# Patient Record
Sex: Female | Born: 1975 | Hispanic: No | Marital: Married | Smoking: Never smoker
Health system: Southern US, Community
[De-identification: ages and names within clinical notes are randomized; demographics above are authoritative.]

## PROBLEM LIST (undated history)

## (undated) DIAGNOSIS — R011 Cardiac murmur, unspecified: Secondary | ICD-10-CM

## (undated) HISTORY — PX: NO PAST SURGERIES: SHX2092

---

## 2012-07-25 ENCOUNTER — Other Ambulatory Visit (HOSPITAL_COMMUNITY): Payer: Self-pay | Admitting: Internal Medicine

## 2012-07-25 DIAGNOSIS — R011 Cardiac murmur, unspecified: Secondary | ICD-10-CM

## 2012-07-30 ENCOUNTER — Ambulatory Visit (HOSPITAL_COMMUNITY)
Admission: RE | Admit: 2012-07-30 | Discharge: 2012-07-30 | Disposition: A | Discharge status: Home / Self Care | Payer: Medicaid Other | Source: Ambulatory Visit | Attending: Internal Medicine | Admitting: Internal Medicine

## 2012-07-30 DIAGNOSIS — R011 Cardiac murmur, unspecified: Secondary | ICD-10-CM | POA: Insufficient documentation

## 2012-07-30 DIAGNOSIS — I059 Rheumatic mitral valve disease, unspecified: Secondary | ICD-10-CM | POA: Insufficient documentation

## 2012-07-30 NOTE — Progress Notes (Signed)
[facility]  2D Echocardiogram completed.  Branton Einstein L Nafeesah Lapaglia, RDCS 07/30/2012 1:45 PM

## 2013-01-15 ENCOUNTER — Encounter (HOSPITAL_COMMUNITY): Payer: Self-pay | Admitting: Emergency Medicine

## 2013-01-15 ENCOUNTER — Emergency Department (HOSPITAL_COMMUNITY)
Admission: EM | Admit: 2013-01-15 | Discharge: 2013-01-15 | Disposition: A | Discharge status: Home / Self Care | Payer: Medicaid Other | Attending: Emergency Medicine | Admitting: Emergency Medicine

## 2013-01-15 DIAGNOSIS — K047 Periapical abscess without sinus: Secondary | ICD-10-CM

## 2013-01-15 DIAGNOSIS — R599 Enlarged lymph nodes, unspecified: Secondary | ICD-10-CM | POA: Insufficient documentation

## 2013-01-15 MED ORDER — HYDROCODONE-ACETAMINOPHEN 5-325 MG PO TABS
1.0000 | ORAL_TABLET | Freq: Once | ORAL | Status: AC
  Administered 2013-01-15: 1 via ORAL
  Filled 2013-01-15: qty 1

## 2013-01-15 MED ORDER — PENICILLIN V POTASSIUM 500 MG PO TABS: 500.0000 mg | ORAL_TABLET | Freq: Four times a day (QID) | ORAL | Status: DC

## 2013-01-15 MED ORDER — IBUPROFEN 200 MG PO TABS
600.0000 mg | ORAL_TABLET | Freq: Once | ORAL | Status: AC
  Administered 2013-01-15: 600 mg via ORAL
  Filled 2013-01-15: qty 3

## 2013-01-15 MED ORDER — HYDROCODONE-ACETAMINOPHEN 5-325 MG PO TABS: ORAL_TABLET | ORAL | Status: DC

## 2013-01-15 NOTE — ED Provider Notes (Signed)
CSN: [id number]     Arrival date & time 01/15/13  8657 History   First MD Initiated Contact with Patient 01/15/13 0804     Chief Complaint  Patient presents with  . Dental Pain   (Consider location/radiation/quality/duration/timing/severity/associated sxs/prior Treatment) HPI  Patient reports she has had a toothache in the right upper maxilla for the past 2 weeks, off and on. She has seen a dentist and has an appointment in 3 days to have a molar removed on the right upper and left upper maxilla.She reports this morning she woke up with some swelling in her face with some increased pain. She denies any fever or chills.  PCP  [facility]  History reviewed. No pertinent past medical history. History reviewed. No pertinent past surgical history. History reviewed. No pertinent family history. History  Substance Use Topics  . Smoking status: Never Smoker   . Smokeless tobacco: Not on file  . Alcohol Use: No   employed   OB History   Grav Para Term Preterm Abortions TAB SAB Ect Mult Living   5              Review of Systems  All other systems reviewed and are negative.    Allergies  Review of patient's allergies indicates no known allergies.  Home Medications   Current Outpatient Rx  Name  Route  Sig  Dispense  Refill  . acetaminophen (TYLENOL) 500 MG tablet   Oral   Take 500 mg by mouth daily as needed for moderate pain.         . benzocaine (ORAJEL) 10 % mucosal gel   Mouth/Throat   Use as directed 1 application in the mouth or throat as needed for mouth pain.          BP 115/73  Pulse 61  Temp(Src) 98.7 F (37.1 C) (Oral)  Ht 5\' 4"  (1.626 m)  Wt 110 lb (49.896 kg)  BMI 18.87 kg/m2  SpO2 100%  LMP 01/15/2013  Vital signs normal   Physical Exam  Nursing note and vitals reviewed. Constitutional: She is oriented to person, place, and time. She appears well-developed and well-nourished.  Non-toxic appearance. She does not appear ill. No  distress.  HENT:  Head: Normocephalic and atraumatic.    Right Ear: External ear normal.  Left Ear: External ear normal.  Nose: Nose normal. No mucosal edema or rhinorrhea.  Mouth/Throat: Oropharynx is clear and moist and mucous membranes are normal. No dental abscesses or uvula swelling.    Small area of swelling noted in the right face as shown.   Pt has several dental caries, also in the left upper, right lower, right upper  Eyes: Conjunctivae and EOM are normal. Pupils are equal, round, and reactive to light.  Neck: Normal range of motion and full passive range of motion without pain. Neck supple.  Pulmonary/Chest: Effort normal. No respiratory distress. She has no rhonchi. She exhibits no crepitus.  Abdominal: Normal appearance.  Musculoskeletal: Normal range of motion.  Moves all extremities well.   Lymphadenopathy:    She has cervical adenopathy.       Right cervical: Posterior cervical adenopathy present.       Left cervical: Posterior cervical adenopathy present.  Neurological: She is alert and oriented to person, place, and time. She has normal strength. No cranial nerve deficit.  Skin: Skin is warm, dry and intact. No rash noted. No erythema. No pallor.  Psychiatric: She has a normal mood and affect. Her  speech is normal and behavior is normal. Her mood appears not anxious.    ED Course  Procedures (including critical care time)  Medications  HYDROcodone-acetaminophen (NORCO/VICODIN) 5-325 MG per tablet 1 tablet (not administered)  ibuprofen (ADVIL,MOTRIN) tablet 600 mg (not administered)      Labs Review Labs Reviewed - No data to display Imaging Review No results found.  EKG Interpretation   None       MDM   1. Abscessed tooth     New Prescriptions   HYDROCODONE-ACETAMINOPHEN (NORCO/VICODIN) 5-325 MG PER TABLET    Take 1 or 2 po Q 6hrs for pain   PENICILLIN V POTASSIUM (VEETID) 500 MG TABLET    Take 1 tablet (500 mg total) by mouth 4 (four) times  daily.    Plan discharge   Devoria Albe, MD, Franz Dell, MD 01/16/13 [phone]

## 2013-01-15 NOTE — ED Notes (Signed)
Pt reports right upper side dental pain x 2 weeks; appt on Friday to have tooth removed. Pt awoke this AM to swelling of right side of face.

## 2013-09-27 ENCOUNTER — Telehealth: Payer: Self-pay | Admitting: Hematology and Oncology

## 2013-09-27 NOTE — Telephone Encounter (Signed)
PER PATIENT WILL CALL BACK TO SCHEDULE APPT HAS ANOTHER APPT ON TODAY W/Gina Copeland TO DISCUSS LABS, CONTACT NUMBER WAS GIVEN.

## 2013-12-02 ENCOUNTER — Encounter (HOSPITAL_COMMUNITY): Payer: Self-pay | Admitting: Emergency Medicine

## 2015-06-10 ENCOUNTER — Emergency Department (HOSPITAL_COMMUNITY)
Admission: EM | Admit: 2015-06-10 | Discharge: 2015-06-10 | Disposition: A | Discharge status: Home / Self Care | Payer: No Typology Code available for payment source | Attending: Emergency Medicine | Admitting: Emergency Medicine

## 2015-06-10 ENCOUNTER — Encounter (HOSPITAL_COMMUNITY): Payer: Self-pay | Admitting: Emergency Medicine

## 2015-06-10 ENCOUNTER — Emergency Department (HOSPITAL_COMMUNITY): Payer: No Typology Code available for payment source

## 2015-06-10 DIAGNOSIS — Y998 Other external cause status: Secondary | ICD-10-CM | POA: Insufficient documentation

## 2015-06-10 DIAGNOSIS — Y9389 Activity, other specified: Secondary | ICD-10-CM | POA: Insufficient documentation

## 2015-06-10 DIAGNOSIS — Z792 Long term (current) use of antibiotics: Secondary | ICD-10-CM | POA: Insufficient documentation

## 2015-06-10 DIAGNOSIS — S8991XA Unspecified injury of right lower leg, initial encounter: Secondary | ICD-10-CM | POA: Diagnosis not present

## 2015-06-10 DIAGNOSIS — Y92410 Unspecified street and highway as the place of occurrence of the external cause: Secondary | ICD-10-CM | POA: Diagnosis not present

## 2015-06-10 DIAGNOSIS — M25561 Pain in right knee: Secondary | ICD-10-CM

## 2015-06-10 MED ORDER — NAPROXEN 500 MG PO TABS: 500.0000 mg | ORAL_TABLET | Freq: Two times a day (BID) | ORAL | Status: DC
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
## 2015-06-10 NOTE — Discharge Instructions (Signed)
Take the prescribed medication as directed. °Follow-up with your primary care doctor. °Return to the ED for new or worsening symptoms. °

## 2015-08-15 ENCOUNTER — Encounter (HOSPITAL_COMMUNITY): Payer: Self-pay | Admitting: *Deleted

## 2015-08-15 ENCOUNTER — Ambulatory Visit (HOSPITAL_COMMUNITY)
Admission: EM | Admit: 2015-08-15 | Discharge: 2015-08-15 | Disposition: A | Discharge status: Home / Self Care | Payer: Self-pay | Attending: Emergency Medicine | Admitting: Emergency Medicine

## 2015-08-15 DIAGNOSIS — M791 Myalgia, unspecified site: Secondary | ICD-10-CM

## 2015-08-15 DIAGNOSIS — M545 Low back pain, unspecified: Secondary | ICD-10-CM

## 2015-08-15 DIAGNOSIS — T148 Other injury of unspecified body region: Secondary | ICD-10-CM

## 2015-08-15 DIAGNOSIS — T148XXA Other injury of unspecified body region, initial encounter: Secondary | ICD-10-CM

## 2015-08-15 MED ORDER — NAPROXEN 375 MG PO TABS: 375.0000 mg | ORAL_TABLET | Freq: Two times a day (BID) | ORAL | Status: DC

## 2015-08-15 NOTE — Discharge Instructions (Signed)
Back Pain, Adult °Back pain is very common in adults. The cause of back pain is rarely dangerous and the pain often gets better over time. The cause of your back pain may not be known. Some common causes of back pain include: °· Strain of the muscles or ligaments supporting the spine. °· Wear and tear (degeneration) of the spinal disks. °· Arthritis. °· Direct injury to the back. °For many people, back pain may return. Since back pain is rarely dangerous, most people can learn to manage this condition on their own. °HOME CARE INSTRUCTIONS °Watch your back pain for any changes. The following actions may help to lessen any discomfort you are feeling: °· Remain active. It is stressful on your back to sit or stand in one place for long periods of time. Do not sit, drive, or stand in one place for more than 30 minutes at a time. Take short walks on even surfaces as soon as you are able. Try to increase the length of time you walk each day. °· Exercise regularly as directed by your health care provider. Exercise helps your back heal faster. It also helps avoid future injury by keeping your muscles strong and flexible. °· Do not stay in bed. Resting more than 1-2 days can delay your recovery. °· Pay attention to your body when you bend and lift. The most comfortable positions are those that put less stress on your recovering back. Always use proper lifting techniques, including: °¨ Bending your knees. °¨ Keeping the load close to your body. °¨ Avoiding twisting. °· Find a comfortable position to sleep. Use a firm mattress and lie on your side with your knees slightly bent. If you lie on your back, put a pillow under your knees. °· Avoid feeling anxious or stressed. Stress increases muscle tension and can worsen back pain. It is important to recognize when you are anxious or stressed and learn ways to manage it, such as with exercise. °· Take medicines only as directed by your health care provider. Over-the-counter  medicines to reduce pain and inflammation are often the most helpful. Your health care provider may prescribe muscle relaxant drugs. These medicines help dull your pain so you can more quickly return to your normal activities and healthy exercise. °· Apply ice to the injured area: °¨ Put ice in a plastic bag. °¨ Place a towel between your skin and the bag. °¨ Leave the ice on for 20 minutes, 2-3 times a day for the first 2-3 days. After that, ice and heat may be alternated to reduce pain and spasms. °· Maintain a healthy weight. Excess weight puts extra stress on your back and makes it difficult to maintain good posture. °SEEK MEDICAL CARE IF: °· You have pain that is not relieved with rest or medicine. °· You have increasing pain going down into the legs or buttocks. °· You have pain that does not improve in one week. °· You have night pain. °· You lose weight. °· You have a fever or chills. °SEEK IMMEDIATE MEDICAL CARE IF:  °· You develop new bowel or bladder control problems. °· You have unusual weakness or numbness in your arms or legs. °· You develop nausea or vomiting. °· You develop abdominal pain. °· You feel faint. °  °This information is not intended to replace advice given to you by your health care provider. Make sure you discuss any questions you have with your health care provider. °  °Document Released: 01/17/2005 Document Revised: 02/07/2014 Document Reviewed: 05/21/2013 °Elsevier Interactive Patient Education ©2016 Elsevier   Inc.  Muscle Pain, Adult Muscle pain (myalgia) may be caused by many things, including:  Overuse or muscle strain, especially if you are not in shape. This is the most common cause of muscle pain.  Injury.  Bruises.  Viruses, such as the flu.  Infectious diseases.  Fibromyalgia, which is a chronic condition that causes muscle tenderness, fatigue, and headache.  Autoimmune diseases, including lupus.  Certain drugs, including ACE inhibitors and statins. Muscle  pain may be mild or severe. In most cases, the pain lasts only a short time and goes away without treatment. To diagnose the cause of your muscle pain, your health care provider will take your medical history. This means he or she will ask you when your muscle pain began and what has been happening. If you have not had muscle pain for very long, your health care provider may want to wait before doing much testing. If your muscle pain has lasted a long time, your health care provider may want to run tests right away. If your health care provider thinks your muscle pain may be caused by illness, you may need to have additional tests to rule out certain conditions.  Treatment for muscle pain depends on the cause. Home care is often enough to relieve muscle pain. Your health care provider may also prescribe anti-inflammatory medicine. HOME CARE INSTRUCTIONS Watch your condition for any changes. The following actions may help to lessen any discomfort you are feeling:  Only take over-the-counter or prescription medicines as directed by your health care provider.  Apply ice to the sore muscle:  Put ice in a plastic bag.  Place a towel between your skin and the bag.  Leave the ice on for 15-20 minutes, 3-4 times a day.  You may alternate applying hot and cold packs to the muscle as directed by your health care provider.  If overuse is causing your muscle pain, slow down your activities until the pain goes away.  Remember that it is normal to feel some muscle pain after starting a workout program. Muscles that have not been used often will be sore at first.  Do regular, gentle exercises if you are not usually active.  Warm up before exercising to lower your risk of muscle pain.  Do not continue working out if the pain is very bad. Bad pain could mean you have injured a muscle. SEEK MEDICAL CARE IF:  Your muscle pain gets worse, and medicines do not help.  You have muscle pain that lasts longer  than 3 days.  You have a rash or fever along with muscle pain.  You have muscle pain after a tick bite.  You have muscle pain while working out, even though you are in good physical condition.  You have redness, soreness, or swelling along with muscle pain.  You have muscle pain after starting a new medicine or changing the dose of a medicine. SEEK IMMEDIATE MEDICAL CARE IF:  You have trouble breathing.  You have trouble swallowing.  You have muscle pain along with a stiff neck, fever, and vomiting.  You have severe muscle weakness or cannot move part of your body. MAKE SURE YOU:   Understand these instructions.  Will watch your condition.  Will get help right away if you are not doing well or get worse.   This information is not intended to replace advice given to you by your health care provider. Make sure you discuss any questions you have with your health care provider.  Document Released: 12/09/2005 Document Revised: 02/07/2014 Document Reviewed: 11/13/2012 Elsevier Interactive Patient Education [employer].
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
## 2016-05-25 ENCOUNTER — Other Ambulatory Visit: Payer: Self-pay | Admitting: Internal Medicine

## 2016-05-25 ENCOUNTER — Ambulatory Visit
Admission: RE | Admit: 2016-05-25 | Discharge: 2016-05-25 | Disposition: A | Discharge status: Home / Self Care | Payer: Medicaid Other | Source: Ambulatory Visit | Attending: Internal Medicine | Admitting: Internal Medicine

## 2016-05-25 DIAGNOSIS — R7611 Nonspecific reaction to tuberculin skin test without active tuberculosis: Secondary | ICD-10-CM

## 2016-12-06 ENCOUNTER — Other Ambulatory Visit: Payer: Self-pay

## 2016-12-06 ENCOUNTER — Ambulatory Visit (INDEPENDENT_AMBULATORY_CARE_PROVIDER_SITE_OTHER): Payer: Worker's Compensation | Admitting: Emergency Medicine

## 2016-12-06 ENCOUNTER — Encounter: Payer: Self-pay | Admitting: Emergency Medicine

## 2016-12-06 VITALS — BP 120/72 | HR 70 | Temp 98.3°F | Resp 16 | Ht 64.5 in | Wt 115.8 lb

## 2016-12-06 DIAGNOSIS — S0993XA Unspecified injury of face, initial encounter: Secondary | ICD-10-CM | POA: Diagnosis not present

## 2016-12-06 DIAGNOSIS — S0592XA Unspecified injury of left eye and orbit, initial encounter: Secondary | ICD-10-CM | POA: Insufficient documentation

## 2016-12-06 NOTE — Patient Instructions (Addendum)
     IF you received an x-ray today, you will receive an invoice from [facility]. Please contact [facility] at [phone] with questions or concerns regarding your invoice.   IF you received labwork today, you will receive an invoice from [facility]. Please contact [facility] at [phone] with questions or concerns regarding your invoice.   Our billing staff will not be able to assist you with questions regarding bills from these companies.  You will be contacted with the lab results as soon as they are available. The fastest way to get your results is to activate your My Chart account. Instructions are located on the last page of this paperwork. If you have not heard from [place] regarding the results in 2 weeks, please contact this office.     Eye Contusion An eye contusion is a deep bruise of the eye or the area around the eye. This is often called a "black eye." A black eye can happen when an injury causes bleeding under the skin. The skin over the bruise may turn blue, purple, or yellow. Minor injuries may be painless. Worse bruises may be painful and swollen for a few weeks. A black eye can affect your eyeball and your sight. Follow these instructions at home:  If directed, put ice on the injured area: ? Put ice in a plastic bag. ? Place a towel between your skin and the bag. ? Leave the ice on for 20 minutes, 2-3 times per day.  Take over-the-counter and prescription medicines only as told by your doctor.  Sleep with your head raised (elevated). You may do this by putting an extra pillow under your head.  Return to your normal activities as told by your doctor. Ask your doctor what activities are safe for you.  Keep all follow-up visits as told by your doctor. This is important. Contact a doctor if:  Your symptoms do not get better after several days.  Medicine does not help your swelling or pain. Get help right away if:  You lose your sight.  You are  seeing double.  Your eye suddenly turns red.  The black part of your eye (pupil) is an odd shape or size.  You have very bad pain.  You feel sick to your stomach (nauseous).  You throw up (vomit).  You feel dizzy or sleepy, or you feel like you will pass out (faint).  You pass out.  You have a lot of clear fluid coming from your eye or nose. This information is not intended to replace advice given to you by your health care provider. Make sure you discuss any questions you have with your health care provider. Document Released: 01/06/2011 Document Revised: 06/25/2015 Document Reviewed: 09/23/2014 Elsevier Interactive Patient Education  [employer].

## 2016-12-06 NOTE — Progress Notes (Signed)
Gina Copeland 41 y.o.   Chief Complaint  Patient presents with  . worker's comp    injury to LEFT eye on 12/06/2016    HISTORY OF PRESENT ILLNESS: This is a 41 y.o. female complaining of left eye injury sustained today at work; no visual problems.  HPI   Prior to Admission medications   Medication Sig Start Date End Date Taking? Authorizing Provider  acetaminophen (TYLENOL) 500 MG tablet Take 500 mg by mouth daily as needed for moderate pain.    [provider]  benzocaine (ORAJEL) 10 % mucosal gel Use as directed 1 application in the mouth or throat as needed for mouth pain.    [provider]  HYDROcodone-acetaminophen (NORCO/VICODIN) 5-325 MG per tablet Take 1 or 2 po Q 6hrs for pain Patient not taking: Reported on 12/06/2016 01/15/13   Rolland Porter, MD  naproxen (NAPROSYN) 375 MG tablet Take 1 tablet (375 mg total) by mouth 2 (two) times daily. Patient not taking: Reported on 12/06/2016 08/15/15   Janne Napoleon, NP  penicillin v potassium (VEETID) 500 MG tablet Take 1 tablet (500 mg total) by mouth 4 (four) times daily. Patient not taking: Reported on 12/06/2016 01/15/13   Rolland Porter, MD    No Known Allergies  There are no active problems to display for this patient.   No past medical history on file.  No past surgical history on file.  Social History   Socioeconomic History  . Marital status: Married    Spouse name: Not on file  . Number of children: Not on file  . Years of education: Not on file  . Highest education level: Not on file  Social Needs  . Financial resource strain: Not on file  . Food insecurity - worry: Not on file  . Food insecurity - inability: Not on file  . Transportation needs - medical: Not on file  . Transportation needs - non-medical: Not on file  Occupational History  . Not on file  Tobacco Use  . Smoking status: Never Smoker  . Smokeless tobacco: Never Used  Substance and Sexual Activity  . Alcohol use: No  . Drug use: No    . Sexual activity: Not on file  Other Topics Concern  . Not on file  Social History Narrative  . Not on file    No family history on file.   Review of Systems  Constitutional: Negative for chills and fever.  HENT: Negative for ear discharge.   Eyes: Negative for blurred vision, double vision, photophobia, pain, discharge and redness.  Respiratory: Negative for shortness of breath.   Cardiovascular: Negative for chest pain.  Gastrointestinal: Negative for nausea and vomiting.  Skin: Negative for rash.  Neurological: Negative for dizziness, loss of consciousness and headaches.  All other systems reviewed and are negative.  Vitals:   12/06/16 1457  BP: 120/72  Pulse: 70  Resp: 16  Temp: 98.3 F (36.8 C)  SpO2: 100%     Physical Exam  Constitutional: She is oriented to person, place, and time. She appears well-developed and well-nourished.  HENT:  Head: Normocephalic and atraumatic.  Right Ear: External ear normal.  Left Ear: External ear normal.  Mouth/Throat: Oropharynx is clear and moist.  Eyes: Conjunctivae, EOM and lids are normal. Pupils are equal, round, and reactive to light. Right conjunctiva is not injected. Right conjunctiva has no hemorrhage. Left conjunctiva is not injected. Left conjunctiva has no hemorrhage.  Neck: Normal range of motion. Neck supple.  Cardiovascular: Normal  rate.  Pulmonary/Chest: Effort normal.  Musculoskeletal: Normal range of motion.  Neurological: She is alert and oriented to person, place, and time. No sensory deficit. She exhibits normal muscle tone.  Skin: Skin is warm and dry.  Psychiatric: She has a normal mood and affect. Her behavior is normal.  Vitals reviewed.    ASSESSMENT & PLAN: Gina Copeland was seen today for worker's comp.  Diagnoses and all orders for this visit:  Left eye injury, initial encounter  Facial injury, initial encounter    Patient Instructions       IF you received an x-ray today, you will  receive an invoice from [facility]. Please contact [facility] at [phone] with questions or concerns regarding your invoice.   IF you received labwork today, you will receive an invoice from [facility]. Please contact [facility] at [phone] with questions or concerns regarding your invoice.   Our billing staff will not be able to assist you with questions regarding bills from these companies.  You will be contacted with the lab results as soon as they are available. The fastest way to get your results is to activate your My Chart account. Instructions are located on the last page of this paperwork. If you have not heard from [place] regarding the results in 2 weeks, please contact this office.     Eye Contusion An eye contusion is a deep bruise of the eye or the area around the eye. This is often called a "black eye." A black eye can happen when an injury causes bleeding under the skin. The skin over the bruise may turn blue, purple, or yellow. Minor injuries may be painless. Worse bruises may be painful and swollen for a few weeks. A black eye can affect your eyeball and your sight. Follow these instructions at home:  If directed, put ice on the injured area: ? Put ice in a plastic bag. ? Place a towel between your skin and the bag. ? Leave the ice on for 20 minutes, 2-3 times per day.  Take over-the-counter and prescription medicines only as told by your doctor.  Sleep with your head raised (elevated). You may do this by putting an extra pillow under your head.  Return to your normal activities as told by your doctor. Ask your doctor what activities are safe for you.  Keep all follow-up visits as told by your doctor. This is important. Contact a doctor if:  Your symptoms do not get better after several days.  Medicine does not help your swelling or pain. Get help right away if:  You lose your sight.  You are seeing double.  Your eye suddenly turns red.  The  black part of your eye (pupil) is an odd shape or size.  You have very bad pain.  You feel sick to your stomach (nauseous).  You throw up (vomit).  You feel dizzy or sleepy, or you feel like you will pass out (faint).  You pass out.  You have a lot of clear fluid coming from your eye or nose. This information is not intended to replace advice given to you by your health care provider. Make sure you discuss any questions you have with your health care provider. Document Released: 01/06/2011 Document Revised: 06/25/2015 Document Reviewed: 09/23/2014 Elsevier Interactive Patient Education  2018 Elsevier Inc.      Agustina Caroli, MD [facility]

## 2018-02-15 ENCOUNTER — Other Ambulatory Visit: Payer: Self-pay | Admitting: Family Medicine

## 2018-02-15 DIAGNOSIS — Z1231 Encounter for screening mammogram for malignant neoplasm of breast: Secondary | ICD-10-CM

## 2019-06-17 ENCOUNTER — Other Ambulatory Visit: Payer: Self-pay

## 2019-06-17 ENCOUNTER — Inpatient Hospital Stay (HOSPITAL_COMMUNITY): Payer: Medicaid Other

## 2019-06-17 ENCOUNTER — Encounter (HOSPITAL_COMMUNITY): Payer: Self-pay | Admitting: Obstetrics and Gynecology

## 2019-06-17 ENCOUNTER — Inpatient Hospital Stay (HOSPITAL_COMMUNITY)
Admission: AD | Admit: 2019-06-17 | Discharge: 2019-06-17 | Disposition: A | Discharge status: Home / Self Care | Payer: Medicaid Other | Attending: Obstetrics and Gynecology | Admitting: Obstetrics and Gynecology

## 2019-06-17 DIAGNOSIS — O209 Hemorrhage in early pregnancy, unspecified: Secondary | ICD-10-CM | POA: Insufficient documentation

## 2019-06-17 DIAGNOSIS — Z3A00 Weeks of gestation of pregnancy not specified: Secondary | ICD-10-CM

## 2019-06-17 DIAGNOSIS — Z3A09 9 weeks gestation of pregnancy: Secondary | ICD-10-CM | POA: Insufficient documentation

## 2019-06-17 DIAGNOSIS — O09521 Supervision of elderly multigravida, first trimester: Secondary | ICD-10-CM | POA: Diagnosis not present

## 2019-06-17 DIAGNOSIS — D259 Leiomyoma of uterus, unspecified: Secondary | ICD-10-CM | POA: Diagnosis not present

## 2019-06-17 DIAGNOSIS — O3411 Maternal care for benign tumor of corpus uteri, first trimester: Secondary | ICD-10-CM | POA: Diagnosis not present

## 2019-06-17 DIAGNOSIS — N939 Abnormal uterine and vaginal bleeding, unspecified: Secondary | ICD-10-CM | POA: Diagnosis present

## 2019-06-17 DIAGNOSIS — R109 Unspecified abdominal pain: Secondary | ICD-10-CM | POA: Insufficient documentation

## 2019-06-17 DIAGNOSIS — O26891 Other specified pregnancy related conditions, first trimester: Secondary | ICD-10-CM | POA: Diagnosis not present

## 2019-06-17 DIAGNOSIS — O3410 Maternal care for benign tumor of corpus uteri, unspecified trimester: Secondary | ICD-10-CM

## 2019-06-17 DIAGNOSIS — O3680X0 Pregnancy with inconclusive fetal viability, not applicable or unspecified: Secondary | ICD-10-CM | POA: Diagnosis not present

## 2019-06-17 HISTORY — DX: Cardiac murmur, unspecified: R01.1

## 2019-06-17 LAB — CBC
HCT: 24.9 % — ABNORMAL LOW (ref 36.0–46.0)
Hemoglobin: 7 g/dL — ABNORMAL LOW (ref 12.0–15.0)
MCH: 20.6 pg — ABNORMAL LOW (ref 26.0–34.0)
MCHC: 28.1 g/dL — ABNORMAL LOW (ref 30.0–36.0)
MCV: 73.5 fL — ABNORMAL LOW (ref 80.0–100.0)
Platelets: 196 10*3/uL (ref 150–400)
RBC: 3.39 MIL/uL — ABNORMAL LOW (ref 3.87–5.11)
RDW: 19.8 % — ABNORMAL HIGH (ref 11.5–15.5)
WBC: 6.8 10*3/uL (ref 4.0–10.5)
nRBC: 0.3 % — ABNORMAL HIGH (ref 0.0–0.2)

## 2019-06-17 LAB — I-STAT BETA HCG BLOOD, ED (MC, WL, AP ONLY): I-stat hCG, quantitative: >2000 m[IU]/mL — ABNORMAL HIGH (ref <5)

## 2019-06-17 LAB — HCG, QUANTITATIVE, PREGNANCY: hCG, Beta Chain, Quant, S: 6524 m[IU]/mL — ABNORMAL HIGH (ref <5)

## 2019-06-17 LAB — ABO/RH: ABO/RH(D): O POS

## 2019-06-17 NOTE — Discharge Instructions (Signed)
Return to care   If you have heavier bleeding that soaks through more that 2 pads per hour for an hour or more  If you bleed so much that you feel like you might pass out or you do pass out  If you have significant abdominal pain that is not improved with Tylenol   If you develop a fever > 100.5    Uterine Fibroids  Uterine fibroids (leiomyomas) are noncancerous (benign) tumors that can develop in the uterus. Fibroids may also develop in the fallopian tubes, cervix, or tissues (ligaments) near the uterus. You may have one or many fibroids. Fibroids vary in size, weight, and where they grow in the uterus. Some can become quite large. Most fibroids do not require medical treatment. What are the causes? The cause of this condition is not known. What increases the risk? You are more likely to develop this condition if you:  Are in your 30s or 40s and have not gone through menopause.  Have a family history of this condition.  Are of African-American descent.  Had your first period at an early age (early menarche).  Have not had any children (nulliparity).  Are overweight or obese. What are the signs or symptoms? Many women do not have any symptoms. Symptoms of this condition may include:  Heavy menstrual bleeding.  Bleeding or spotting between periods.  Pain and pressure in the pelvic area, between the hips.  Bladder problems, such as needing to urinate urgently or more often than usual.  Inability to have children (infertility).  Failure to carry pregnancy to term (miscarriage). How is this diagnosed? This condition may be diagnosed based on:  Your symptoms and medical history.  A physical exam.  A pelvic exam that includes feeling for any tumors.  Imaging tests, such as ultrasound or MRI. How is this treated? Treatment for this condition may include:  Seeing your health care provider for follow-up visits to monitor your fibroids for any changes.  Taking  NSAIDs such as ibuprofen, naproxen, or aspirin to reduce pain.  Hormone medicines. These may be taken as a pill, given in an injection, or delivered by a T-shaped device that is inserted into the uterus (intrauterine device, IUD).  Surgery to remove one of the following: ? The fibroids (myomectomy). Your health care provider may recommend this if fibroids affect your fertility and you want to become pregnant. ? The uterus (hysterectomy). ? Blood supply to the fibroids (uterine artery embolization). Follow these instructions at home:  Take over-the-counter and prescription medicines only as told by your health care provider.  Ask your health care provider if you should take iron pills or eat more iron-rich foods, such as dark green, leafy vegetables. Heavy menstrual bleeding can cause low iron levels.  If directed, apply heat to your back or abdomen to reduce pain. Use the heat source that your health care provider recommends, such as a moist heat pack or a heating pad. ? Place a towel between your skin and the heat source. ? Leave the heat on for 20-30 minutes. ? Remove the heat if your skin turns bright red. This is especially important if you are unable to feel pain, heat, or cold. You may have a greater risk of getting burned.  Pay close attention to your menstrual cycle. Tell your health care provider about any changes, such as: ? Increased blood flow that requires you to use more pads or tampons than usual. ? A change in the number of days that  your period lasts. ? A change in symptoms that are associated with your period, such as back pain or cramps in your abdomen.  Keep all follow-up visits as told by your health care provider. This is important, especially if your fibroids need to be monitored for any changes. Contact a health care provider if you:  Have pelvic pain, back pain, or cramps in your abdomen that do not get better with medicine or heat.  Develop new bleeding between  periods.  Have increased bleeding during or between periods.  Feel unusually tired or weak.  Feel light-headed. Get help right away if you:  Faint.  Have pelvic pain that suddenly gets worse.  Have severe vaginal bleeding that soaks a tampon or pad in 30 minutes or less. Summary  Uterine fibroids are noncancerous (benign) tumors that can develop in the uterus.  The exact cause of this condition is not known.  Most fibroids do not require medical treatment unless they affect your ability to have children (fertility).  Contact a health care provider if you have pelvic pain, back pain, or cramps in your abdomen that do not get better with medicines.  Make sure you know what symptoms should cause you to get help right away. This information is not intended to replace advice given to you by your health care provider. Make sure you discuss any questions you have with your health care provider. Document Revised: 12/30/2016 Document Reviewed: 12/13/2016 Elsevier Patient Education  2020 [employer].

## 2019-06-17 NOTE — MAU Provider Note (Signed)
Chief Complaint: Vaginal Bleeding   First Provider Initiated Contact with Patient 06/17/19 1600     SUBJECTIVE HPI: Gina Copeland is a 44 y.o. [id number] at [redacted]w[redacted]d who presents to Maternity Admissions reporting vaginal bleeding and abdominal cramping.  Symptoms started this morning.  Reports bleeding like a period and passed something that looked like tissue.  Bleeding and pain have not slowed down since passing tissue.  Has not been seen for prenatal care yet.  This is her first pregnancy since moving to [place].  No other complaints.  Location: abdomen Quality: cramping Severity: 8/10 on pain scale Duration: 1 day Timing: intermittent Modifying factors: none. Hasn't treated symptoms Associated signs and symptoms: vaginal bleeding  Past Medical History:  Diagnosis Date  . Heart murmur    OB History  Gravida Para Term Preterm AB Living  6 5 3 2   5   SAB TAB Ectopic Multiple Live Births          5    # Outcome Date GA Lbr Len/2nd Weight Sex Delivery Anes PTL Lv  6 Current           5 Term      Vag-Spont   LIV  4 Term      Vag-Spont   LIV  3 Term      Vag-Spont   LIV  2 Preterm  [redacted]w[redacted]d    Vag-Spont   LIV  1 Preterm  [redacted]w[redacted]d    Vag-Spont   LIV   Past Surgical History:  Procedure Laterality Date  . NO PAST SURGERIES     Social History   Socioeconomic History  . Marital status: Married    Spouse name: Not on file  . Number of children: Not on file  . Years of education: Not on file  . Highest education level: Not on file  Occupational History  . Not on file  Tobacco Use  . Smoking status: Never Smoker  . Smokeless tobacco: Never Used  Substance and Sexual Activity  . Alcohol use: No  . Drug use: No  . Sexual activity: Not on file  Other Topics Concern  . Not on file  Social History Narrative  . Not on file   Social Determinants of Health   Financial Resource Strain:   . Difficulty of Paying Living Expenses:   Food Insecurity:   . Worried About Sales executive in the Last Year:   . Arboriculturist in the Last Year:   Transportation Needs:   . Film/video editor (Medical):   Marland Kitchen Lack of Transportation (Non-Medical):   Physical Activity:   . Days of Exercise per Week:   . Minutes of Exercise per Session:   Stress:   . Feeling of Stress :   Social Connections:   . Frequency of Communication with Friends and Family:   . Frequency of Social Gatherings with Friends and Family:   . Attends Religious Services:   . Active Member of Clubs or Organizations:   . Attends Archivist Meetings:   Marland Kitchen Marital Status:   Intimate Partner Violence:   . Fear of Current or Ex-Partner:   . Emotionally Abused:   Marland Kitchen Physically Abused:   . Sexually Abused:    No family history on file. No current facility-administered medications on file prior to encounter.   Current Outpatient Medications on File Prior to Encounter  Medication Sig Dispense Refill  . Prenatal Vit-Fe Fumarate-FA (PRENATAL MULTIVITAMIN) TABS tablet Take 1 tablet  by mouth daily at 12 noon.    Marland Kitchen acetaminophen (TYLENOL) 500 MG tablet Take 500 mg by mouth daily as needed for moderate pain.    . benzocaine (ORAJEL) 10 % mucosal gel Use as directed 1 application in the mouth or throat as needed for mouth pain.    Marland Kitchen docusate sodium (COLACE) 100 MG capsule Take 100 mg by mouth 2 (two) times daily.    . ferrous sulfate 325 (65 FE) MG tablet Take 325 mg by mouth 2 (two) times daily.     No Known Allergies  I have reviewed patient's Past Medical Hx, Surgical Hx, Family Hx, Social Hx, medications and allergies.   Review of Systems  Constitutional: Negative.   Gastrointestinal: Positive for abdominal pain.  Genitourinary: Positive for vaginal bleeding.    OBJECTIVE Patient Vitals for the past 24 hrs:  BP Temp Temp src Pulse Resp SpO2  06/17/19 1546 123/70 -- -- 63 18 --  06/17/19 1318 (!) 125/53 99.3 F (37.4 C) Oral 72 16 100 %   Constitutional: Well-developed, well-nourished  female in no acute distress.  Cardiovascular: normal rate & rhythm, no murmur Respiratory: normal rate and effort. Lung sounds clear throughout [id number] mass in lower abdomen, firm, non tender.  Abd soft, non-tender, Pos BS x 4. No guarding or rebound tenderness MS: Extremities nontender, no edema, normal ROM Neurologic: Alert and oriented x 4.  GU:     SPECULUM EXAM: NEFG, small amount of dark red blood in vagina cleared out with 2 fox swabs. No tissue at os.   BIMANUAL: No CMT. cervix closed; uterus enlarged 18 wk size, no adnexal tenderness or masses.    LAB RESULTS Results for orders placed or performed during the hospital encounter of 06/17/19 (from the past 24 hour(s))  I-Stat Beta hCG blood, ED ([facility], [facility], [facility] only)     Status: Abnormal   Collection Time: 06/17/19  2:11 PM  Result Value Ref Range   I-stat hCG, quantitative >2,000.0 (H) <5 mIU/mL   Comment 3          CBC     Status: Abnormal   Collection Time: 06/17/19  4:18 PM  Result Value Ref Range   WBC 6.8 4.0 - 10.5 K/uL   RBC 3.39 (L) 3.87 - 5.11 MIL/uL   Hemoglobin 7.0 (L) 12.0 - 15.0 g/dL   HCT 24.9 (L) 36.0 - 46.0 %   MCV 73.5 (L) 80.0 - 100.0 fL   MCH 20.6 (L) 26.0 - 34.0 pg   MCHC 28.1 (L) 30.0 - 36.0 g/dL   RDW 19.8 (H) 11.5 - 15.5 %   Platelets 196 150 - 400 K/uL   nRBC 0.3 (H) 0.0 - 0.2 %  ABO/Rh     Status: None   Collection Time: 06/17/19  4:18 PM  Result Value Ref Range   ABO/RH(D) O POS    No rh immune globuloin      NOT A RH IMMUNE GLOBULIN CANDIDATE, PT RH POSITIVE Performed at [facility], [address] [place], [place] [address]   hCG, quantitative, pregnancy     Status: Abnormal   Collection Time: 06/17/19  4:18 PM  Result Value Ref Range   hCG, Beta Chain, Quant, S 6,524 (H) <5 mIU/mL    IMAGING US OB LESS THAN 14 WEEKS WITH OB TRANSVAGINAL  Result Date: 06/17/2019 CLINICAL DATA:  Vaginal bleeding and abdominal pain in first trimester pregnancy. EXAM: OBSTETRIC <14 WK [place] AND  TRANSVAGINAL OB US TECHNIQUE: Both  transabdominal and transvaginal ultrasound examinations were performed for complete evaluation of the gestation as well as the maternal uterus, adnexal regions, and pelvic cul-de-sac. Transvaginal technique was performed to assess early pregnancy. COMPARISON:  None. FINDINGS: Intrauterine gestational sac: None Yolk sac:  Not Visualized. Embryo:  Not Visualized. Maternal uterus/adnexae: The uterus is anteverted. There is no intrauterine gestational sac. The endometrium appears thickened but is difficult to delineate given ill-defined borders. Large left fundal fibroid measures 8 x 6.5 x 7.7 cm. The right ovary measures 3.4 x 2.1 x 2.8 cm and contains a corpus luteal cyst. Blood flow is noted. The left ovary appears normal measuring 3.2 x 4.2 x 2.9 cm. No extra ovarian adnexal mass or significant pelvic free fluid. IMPRESSION: 1. No intrauterine pregnancy or findings suspicious for ectopic pregnancy. Findings are consistent with pregnancy of unknown location and may reflect early intrauterine pregnancy not yet visualized sonographically, occult ectopic pregnancy, or failed pregnancy. Recommend trending of beta HCG and follow-up ultrasound as clinically indicated. 2. Thickened endometrium with ill-defined borders that are difficult to delineate. 3. Large left uterine fibroid measuring 8.3 cm. 4. Normal sonographic appearance of the ovaries, with corpus luteal cyst on the right. Electronically Signed   By: Keith Rake M.D.   On: 06/17/2019 17:09    MAU COURSE Orders Placed This Encounter  Procedures  . US OB LESS THAN 14 WEEKS WITH OB TRANSVAGINAL  . Urinalysis, Routine w reflex microscopic  . CBC  . hCG, quantitative, pregnancy  . I-Stat Beta hCG blood, ED ([facility], [facility], [facility] only)  . ABO/Rh  . Discharge patient   No orders of the defined types were placed in this encounter.   MDM +UPT UA, wet prep, GC/chlamydia, CBC, ABO/Rh, quant hCG, and [place] today to rule out ectopic  pregnancy which can be life threatening.   RH positive  Ultrasound shows no IUP or adnexal mass. Uterine fibroid measuring 8 cm.  HCG is 6524  Tissue that patient passed earlier, sent to pathology.   Reviewed with Dr. Ilda Basset. Likely completed miscarriage but can't completely exclude ectopic at this time. Patient will return for stat HCG on Thursday. If pathology report shows chorionic villi, can defer stat HCGs.   ASSESSMENT 1. Pregnancy of unknown anatomic location   2. Vaginal bleeding in pregnancy, first trimester   3. Abdominal pain during pregnancy in first trimester   4. Uterine leiomyoma, unspecified location     PLAN Discharge home in stable condition. Discussed reasons to return to MAU Pathology report pending Scheduled for stat hcg at [facility] on Thursday   Allergies as of 06/17/2019   No Known Allergies     Medication List    TAKE these medications   acetaminophen 500 MG tablet Commonly known as: TYLENOL Take 500 mg by mouth daily as needed for moderate pain.   benzocaine 10 % mucosal gel Commonly known as: ORAJEL Use as directed 1 application in the mouth or throat as needed for mouth pain.   docusate sodium 100 MG capsule Commonly known as: COLACE Take 100 mg by mouth 2 (two) times daily.   ferrous sulfate 325 (65 FE) MG tablet Take 325 mg by mouth 2 (two) times daily.   prenatal multivitamin Tabs tablet Take 1 tablet by mouth daily at 12 noon.        Jorje Guild, NP 06/17/2019  6:42 PM

## 2019-06-17 NOTE — ED Provider Notes (Signed)
MSE was initiated and I personally evaluated the patient and placed orders (if any) at  1:32 PM on Jun 16, 2276.  43 year old female presents the ED due to vaginal bleeding and abdominal pain that started this morning.  Patient states she is roughly 3 months pregnant.  Last menstrual cycle March 15th.  She had a positive pregnancy test 2 weeks ago at home.  She sees Dr. Ivy Lynn at [facility] and has not had an OB/GYN appointment yet.  Patient admits to passing a "sac" along with vaginal bleeding.  She notes her vaginal bleeding is enough to fill a pad just like a normal menstrual cycle.  This is her sixth pregnancy with 5 living children.   Abdomen soft, non-distended with diffuse tenderness.   Spoke to [place] at [facility] who agrees to accept transfer for further evaluation.   The patient appears stable so that the remainder of the MSE may be completed by another provider.   Suzy Bouchard, PA-C 06/17/19 1425    Sherwood Gambler, MD 06/18/19 1007

## 2019-06-17 NOTE — MAU Note (Signed)
p stated she started having vag bleeding and cramping this morning. Passed some tissue and is continuing to feel cramping and that soemthing else needs to come out. Small amount of vag bleeding noted at this time

## 2019-06-19 LAB — SURGICAL PATHOLOGY

## 2019-06-20 ENCOUNTER — Ambulatory Visit (INDEPENDENT_AMBULATORY_CARE_PROVIDER_SITE_OTHER): Payer: Self-pay | Admitting: General Practice

## 2019-06-20 ENCOUNTER — Encounter: Payer: Self-pay | Admitting: General Practice

## 2019-06-20 ENCOUNTER — Other Ambulatory Visit: Payer: Self-pay

## 2019-06-20 DIAGNOSIS — O039 Complete or unspecified spontaneous abortion without complication: Secondary | ICD-10-CM

## 2019-06-20 NOTE — Progress Notes (Signed)
Patient presents to office today for follow up bhcg from 5/17 [facility] visit. Per chart review, pathology confirms Ch[facility]- per Junie Panning [place]'s note will order routine bhcg today. Patient reports she is still bleeding like a period but denies pain. Discussed results will be back tomorrow & released to mychart. Discussed Junie Panning will reach out to her once results are back with any follow up that is needed. Patient verbalized understanding & requests work note be extended to Monday- okayed per Dr Kennon Rounds & letter given.   Koren Bound RN BSN 06/20/19

## 2019-06-21 LAB — BETA HCG QUANT (REF LAB): hCG Quant: 996 m[IU]/mL

## 2019-07-18 ENCOUNTER — Encounter: Payer: Self-pay | Admitting: Obstetrics and Gynecology

## 2019-07-18 ENCOUNTER — Ambulatory Visit (INDEPENDENT_AMBULATORY_CARE_PROVIDER_SITE_OTHER): Payer: Medicaid Other | Admitting: Obstetrics and Gynecology

## 2019-07-18 ENCOUNTER — Other Ambulatory Visit: Payer: Self-pay

## 2019-07-18 VITALS — BP 114/71 | HR 75 | Ht 64.0 in | Wt 117.4 lb

## 2019-07-18 DIAGNOSIS — O039 Complete or unspecified spontaneous abortion without complication: Secondary | ICD-10-CM

## 2019-07-18 NOTE — Progress Notes (Signed)
Subjective:  Gina Copeland is a 44 y.o. [id number] here for FU BHCG. She was seen on 06/20/19. Results from that day show HCG 996. She denies vaginal bleeding. She denies abdominal or pelvic pain.  Objective:  Physical Exam  Nursing note and vitals reviewed. Constitutional: She is oriented to person, place, and time. She appears well-developed and well-nourished. No distress.  HENT:  Head: Normocephalic.  Cardiovascular: Normal rate.  Respiratory: Effort normal.  GI: Soft. There is no tenderness.  Neurological: She is alert and oriented to person, place, and time. Skin: Skin is warm and dry.  Psychiatric: She has a normal mood and affect.   No results found for this or any previous visit (from the past 24 hour(s)).  Assessment/Plan:  SAB- Confirmed surgical pathology HCG declined from 6524 to 996 FU in 1 weeks if Hcg is still elevated.    Noni Saupe I, NP 07/18/2019 11:12 AM

## 2019-07-18 NOTE — Patient Instructions (Signed)
Miscarriage A miscarriage is the loss of an unborn baby (fetus) before the 20th week of pregnancy. Follow these instructions at home: Medicines   Take over-the-counter and prescription medicines only as told by your doctor.  If you were prescribed antibiotic medicine, take it as told by your doctor. Do not stop taking the antibiotic even if you start to feel better.  Do not take NSAIDs unless your doctor says that this is safe for you. NSAIDs include aspirin and ibuprofen. These medicines can cause bleeding. Activity  Rest as directed. Ask your doctor what activities are safe for you.  Have someone help you at home during this time. General instructions  Write down how many pads you use each day and how soaked they are.  Watch the amount of tissue or clumps of blood (blood clots) that you pass from your vagina. Save any large amounts of tissue for your doctor.  Do not use tampons, douche, or have sex until your doctor approves.  To help you and your partner with the process of grieving, talk with your doctor or seek counseling.  When you are ready, meet with your doctor to talk about steps you should take for your health. Also, talk with your doctor about steps to take to have a healthy pregnancy in the future.  Keep all follow-up visits as told by your doctor. This is important. Contact a doctor if:  You have a fever or chills.  You have vaginal discharge that smells bad.  You have more bleeding. Get help right away if:  You have very bad cramps or pain in your back or belly.  You pass clumps of blood that are walnut-sized or larger from your vagina.  You pass tissue that is walnut-sized or larger from your vagina.  You soak more than 1 regular pad in an hour.  You get light-headed or weak.  You faint (pass out).  You have feelings of sadness that do not go away, or you have thoughts of hurting yourself. Summary  A miscarriage is the loss of an unborn baby before  the 20th week of pregnancy.  Follow your doctor's instructions for home care. Keep all follow-up appointments.  To help you and your partner with the process of grieving, talk with your doctor or seek counseling. This information is not intended to replace advice given to you by your health care provider. Make sure you discuss any questions you have with your health care provider. Document Revised: 05/11/2018 Document Reviewed: 02/23/2016 Elsevier Patient Education  2020 Elsevier Inc.  

## 2019-07-18 NOTE — Addendum Note (Signed)
Addended by: Bethanne Ginger on: 07/18/2019 11:24 AM   Modules accepted: Orders

## 2019-07-18 NOTE — Addendum Note (Signed)
Addended by: Grier Mitts L on: 07/18/2019 11:30 AM   Modules accepted: Orders

## 2019-07-19 LAB — BETA HCG QUANT (REF LAB): hCG Quant: 2 m[IU]/mL

## 2019-08-01 DIAGNOSIS — Z419 Encounter for procedure for purposes other than remedying health state, unspecified: Secondary | ICD-10-CM | POA: Diagnosis not present

## 2019-09-01 DIAGNOSIS — Z419 Encounter for procedure for purposes other than remedying health state, unspecified: Secondary | ICD-10-CM | POA: Diagnosis not present

## 2019-09-18 DIAGNOSIS — E559 Vitamin D deficiency, unspecified: Secondary | ICD-10-CM | POA: Diagnosis not present

## 2019-09-18 DIAGNOSIS — Z1239 Encounter for other screening for malignant neoplasm of breast: Secondary | ICD-10-CM | POA: Diagnosis not present

## 2019-09-18 DIAGNOSIS — D509 Iron deficiency anemia, unspecified: Secondary | ICD-10-CM | POA: Diagnosis not present

## 2019-09-27 ENCOUNTER — Other Ambulatory Visit: Payer: Self-pay | Admitting: Family Medicine

## 2019-09-27 DIAGNOSIS — Z1231 Encounter for screening mammogram for malignant neoplasm of breast: Secondary | ICD-10-CM

## 2019-10-02 DIAGNOSIS — Z419 Encounter for procedure for purposes other than remedying health state, unspecified: Secondary | ICD-10-CM | POA: Diagnosis not present

## 2019-10-03 ENCOUNTER — Ambulatory Visit
Admission: RE | Admit: 2019-10-03 | Discharge: 2019-10-03 | Disposition: A | Discharge status: Home / Self Care | Payer: Medicaid Other | Source: Ambulatory Visit | Attending: Family Medicine | Admitting: Family Medicine

## 2019-10-03 ENCOUNTER — Other Ambulatory Visit: Payer: Self-pay

## 2019-10-03 DIAGNOSIS — Z1231 Encounter for screening mammogram for malignant neoplasm of breast: Secondary | ICD-10-CM

## 2019-10-09 ENCOUNTER — Other Ambulatory Visit: Payer: Self-pay | Admitting: Family Medicine

## 2019-10-09 DIAGNOSIS — R928 Other abnormal and inconclusive findings on diagnostic imaging of breast: Secondary | ICD-10-CM

## 2019-10-10 ENCOUNTER — Other Ambulatory Visit: Payer: Self-pay | Admitting: Family Medicine

## 2019-10-10 ENCOUNTER — Ambulatory Visit
Admission: RE | Admit: 2019-10-10 | Discharge: 2019-10-10 | Disposition: A | Discharge status: Home / Self Care | Payer: Medicaid Other | Source: Ambulatory Visit | Attending: Family Medicine | Admitting: Family Medicine

## 2019-10-10 ENCOUNTER — Ambulatory Visit: Payer: Medicaid Other

## 2019-10-10 ENCOUNTER — Other Ambulatory Visit: Payer: Self-pay

## 2019-10-10 DIAGNOSIS — R928 Other abnormal and inconclusive findings on diagnostic imaging of breast: Secondary | ICD-10-CM

## 2019-10-10 DIAGNOSIS — R921 Mammographic calcification found on diagnostic imaging of breast: Secondary | ICD-10-CM | POA: Diagnosis not present

## 2019-10-18 ENCOUNTER — Other Ambulatory Visit: Payer: Self-pay | Admitting: Family Medicine

## 2019-10-18 DIAGNOSIS — Z1231 Encounter for screening mammogram for malignant neoplasm of breast: Secondary | ICD-10-CM

## 2019-10-29 DIAGNOSIS — Z1152 Encounter for screening for COVID-19: Secondary | ICD-10-CM | POA: Diagnosis not present

## 2019-10-30 ENCOUNTER — Other Ambulatory Visit: Payer: Self-pay

## 2019-10-30 ENCOUNTER — Ambulatory Visit: Payer: Medicaid Other

## 2019-11-01 DIAGNOSIS — Z419 Encounter for procedure for purposes other than remedying health state, unspecified: Secondary | ICD-10-CM | POA: Diagnosis not present

## 2019-12-02 DIAGNOSIS — Z419 Encounter for procedure for purposes other than remedying health state, unspecified: Secondary | ICD-10-CM | POA: Diagnosis not present

## 2019-12-05 DIAGNOSIS — D509 Iron deficiency anemia, unspecified: Secondary | ICD-10-CM | POA: Diagnosis not present

## 2019-12-05 DIAGNOSIS — E559 Vitamin D deficiency, unspecified: Secondary | ICD-10-CM | POA: Diagnosis not present

## 2019-12-05 DIAGNOSIS — G629 Polyneuropathy, unspecified: Secondary | ICD-10-CM | POA: Diagnosis not present

## 2020-01-01 DIAGNOSIS — Z419 Encounter for procedure for purposes other than remedying health state, unspecified: Secondary | ICD-10-CM | POA: Diagnosis not present

## 2020-01-20 ENCOUNTER — Inpatient Hospital Stay: Payer: Medicaid Other

## 2020-01-20 ENCOUNTER — Other Ambulatory Visit: Payer: Self-pay

## 2020-01-20 ENCOUNTER — Encounter: Payer: Self-pay | Admitting: Hematology and Oncology

## 2020-01-20 ENCOUNTER — Inpatient Hospital Stay: Payer: Medicaid Other | Attending: Hematology and Oncology | Admitting: Hematology and Oncology

## 2020-01-20 VITALS — BP 127/86 | HR 66 | Temp 97.9°F | Resp 15 | Wt 110.8 lb

## 2020-01-20 DIAGNOSIS — D509 Iron deficiency anemia, unspecified: Secondary | ICD-10-CM | POA: Insufficient documentation

## 2020-01-20 DIAGNOSIS — K5900 Constipation, unspecified: Secondary | ICD-10-CM | POA: Insufficient documentation

## 2020-01-20 DIAGNOSIS — D500 Iron deficiency anemia secondary to blood loss (chronic): Secondary | ICD-10-CM | POA: Insufficient documentation

## 2020-01-20 DIAGNOSIS — Z79899 Other long term (current) drug therapy: Secondary | ICD-10-CM | POA: Insufficient documentation

## 2020-01-20 LAB — CBC WITH DIFFERENTIAL (CANCER CENTER ONLY)
Abs Immature Granulocytes: 0 10*3/uL (ref 0.00–0.07)
Basophils Absolute: 0 10*3/uL (ref 0.0–0.1)
Basophils Relative: 1 %
Eosinophils Absolute: 0.1 10*3/uL (ref 0.0–0.5)
Eosinophils Relative: 2 %
HCT: 25.4 % — ABNORMAL LOW (ref 36.0–46.0)
Hemoglobin: 7.6 g/dL — ABNORMAL LOW (ref 12.0–15.0)
Immature Granulocytes: 0 %
Lymphocytes Relative: 45 %
Lymphs Abs: 1.6 10*3/uL (ref 0.7–4.0)
MCH: 22.1 pg — ABNORMAL LOW (ref 26.0–34.0)
MCHC: 29.9 g/dL — ABNORMAL LOW (ref 30.0–36.0)
MCV: 73.8 fL — ABNORMAL LOW (ref 80.0–100.0)
Monocytes Absolute: 0.4 10*3/uL (ref 0.1–1.0)
Monocytes Relative: 12 %
Neutro Abs: 1.4 10*3/uL — ABNORMAL LOW (ref 1.7–7.7)
Neutrophils Relative %: 40 %
Platelet Count: 277 10*3/uL (ref 150–400)
RBC: 3.44 MIL/uL — ABNORMAL LOW (ref 3.87–5.11)
RDW: 21.3 % — ABNORMAL HIGH (ref 11.5–15.5)
WBC Count: 3.5 10*3/uL — ABNORMAL LOW (ref 4.0–10.5)
nRBC: 0 % (ref 0.0–0.2)

## 2020-01-20 LAB — CMP (CANCER CENTER ONLY)
ALT: 11 U/L (ref 0–44)
AST: 16 U/L (ref 15–41)
Albumin: 3.9 g/dL (ref 3.5–5.0)
Alkaline Phosphatase: 70 U/L (ref 38–126)
Anion gap: 6 (ref 5–15)
BUN: 8 mg/dL (ref 6–20)
CO2: 28 mmol/L (ref 22–32)
Calcium: 9.2 mg/dL (ref 8.9–10.3)
Chloride: 107 mmol/L (ref 98–111)
Creatinine: 0.7 mg/dL (ref 0.44–1.00)
GFR, Estimated: >60 mL/min (ref >60)
Glucose, Bld: 99 mg/dL (ref 70–99)
Potassium: 3.7 mmol/L (ref 3.5–5.1)
Sodium: 141 mmol/L (ref 135–145)
Total Bilirubin: 0.6 mg/dL (ref 0.3–1.2)
Total Protein: 7.9 g/dL (ref 6.5–8.1)

## 2020-01-20 LAB — IRON AND TIBC
Iron: 23 ug/dL — ABNORMAL LOW (ref 41–142)
Saturation Ratios: 5 % — ABNORMAL LOW (ref 21–57)
TIBC: 419 ug/dL (ref 236–444)
UIBC: 396 ug/dL — ABNORMAL HIGH (ref 120–384)

## 2020-01-20 LAB — RETIC PANEL
Immature Retic Fract: 12.4 % (ref 2.3–15.9)
RBC.: 3.41 MIL/uL — ABNORMAL LOW (ref 3.87–5.11)
Retic Count, Absolute: 25.9 10*3/uL (ref 19.0–186.0)
Retic Ct Pct: 0.8 % (ref 0.4–3.1)
Reticulocyte Hemoglobin: 23.7 pg — ABNORMAL LOW (ref >27.9)

## 2020-01-20 LAB — FERRITIN: Ferritin: 5 ng/mL — ABNORMAL LOW (ref 11–307)

## 2020-01-20 NOTE — Progress Notes (Signed)
[place] Telephone:(336) [phone]   Fax:(336) [place] NOTE  Patient Care Team: Rogers Blocker, MD as PCP - General (Internal Medicine) Heath Lark, MD as Consulting Physician (Hematology and Oncology)  Hematological/Oncological History # Iron Deficiency Anemia 2/2 to GYN Bleeding 06/17/2019: WBC 6.8, Hgb 7.0, MCV 73.5, Plt 196 09/18/2019: WBC 3.7, Hgb 7.2, MCV 72, Plt 263. Iron sat 5%, ferritin 8. Prescribed ferrous sulfate 325mg  PO BID 01/20/2020: establish care with Dr. Lorenso Courier   CHIEF COMPLAINTS/PURPOSE OF CONSULTATION:  "Iron Deficiency Anemia "  HISTORY OF PRESENTING ILLNESS:  Gina Copeland 44 y.o. female with medical history significant for a miscarriage in May 2021 who presents for evaluation of iron deficiency anemia.   On review of the previous records Gina Copeland has records in our system dating back to May 2021.  At that time she had a miscarriage.  Her white blood cell count showed 6.8, hemoglobin 7.0, MCV 73.5, and a platelet count of 196.  More recently on 09/18/2019 the patient was seen by her primary care provider and was found to have white blood cell count of 3.7, hemoglobin 7.2, MCV 72, and a platelet count of 263 with an iron sat of 5% and a ferritin of 8.  She was prescribed ferrous sulfate 325 mg p.o. twice daily at that time.  Today she presents for evaluation and management of her iron deficiency anemia.  On exam today Gina Copeland that she is actually been taking her iron therapy 3 times daily rather than twice daily.  She reports that she has been struggling with constipation but no other stomach upset while taking this medication.  She reports that she has had iron deficiency anemia "all my life".  And that she has always been anemic.  She notes that she has never had a blood transfusion or received IV iron.  She reports that her menstrual cycles are not particularly heavy and that they occur at regular 28-day intervals and last for about 4  days.  The heaviest days the first day and she typically goes through about 3 pads in the day.  She notes that she has not had any issues with nosebleeding or gum bleeding.  She does not bruise easily.  There are no other overt sources of bleeding.  She reports that she eats a regular diet and she eats everything "except salmon".  She does eat red meat and liver.  On further discussion she endorses having a miscarriage in May 2021.  She has had no additional surgeries or procedures other than wisdom teeth removal in 2004 she has no family history markable for blood disease or blood disorders.  She is a never smoker and never drinker and currently works as a Marine scientist.  She denies having fatigue, tiredness, or food cravings.  She does endorse feeling cold and reports having nerve pain in her lower extremities.  A full 10 point ROS is listed below.  MEDICAL HISTORY:  Past Medical History:  Diagnosis Date  . Heart murmur     SURGICAL HISTORY: Past Surgical History:  Procedure Laterality Date  . NO PAST SURGERIES      SOCIAL HISTORY: Social History   Socioeconomic History  . Marital status: Married    Spouse name: Not on file  . Number of children: Not on file  . Years of education: Not on file  . Highest education level: Not on file  Occupational History  . Not on file  Tobacco Use  . Smoking  status: Never Smoker  . Smokeless tobacco: Never Used  Substance and Sexual Activity  . Alcohol use: No  . Drug use: No  . Sexual activity: Not on file  Other Topics Concern  . Not on file  Social History Narrative  . Not on file   Social Determinants of Health   Financial Resource Strain: Not on file  Food Insecurity: No Food Insecurity  . Worried About Charity fundraiser in the Last Year: Never true  . Ran Out of Food in the Last Year: Never true  Transportation Needs: No Transportation Needs  . Lack of Transportation (Medical): No  . Lack of Transportation (Non-Medical): No  Physical  Activity: Not on file  Stress: Not on file  Social Connections: Not on file  Intimate Partner Violence: Not on file    FAMILY HISTORY: Family History  Problem Relation Age of Onset  . Breast cancer Maternal Grandmother     ALLERGIES:  has No Known Allergies.  MEDICATIONS:  Current Outpatient Medications  Medication Sig Dispense Refill  . acetaminophen (TYLENOL) 500 MG tablet Take 500 mg by mouth daily as needed for moderate pain.    Marland Kitchen docusate sodium (COLACE) 100 MG capsule Take 100 mg by mouth 2 (two) times daily.    . ferrous sulfate 325 (65 FE) MG tablet Take 325 mg by mouth 2 (two) times daily.    Marland Kitchen gabapentin (NEURONTIN) 300 MG capsule Take 300 mg by mouth 3 (three) times daily.    . Vitamin D, Ergocalciferol, (DRISDOL) 1.25 MG (50000 UNIT) CAPS capsule Take by mouth.     No current facility-administered medications for this visit.    REVIEW OF SYSTEMS:   Constitutional: ( - ) fevers, ( - )  chills , ( - ) night sweats Eyes: ( - ) blurriness of vision, ( - ) double vision, ( - ) watery eyes Ears, nose, mouth, throat, and face: ( - ) mucositis, ( - ) sore throat Respiratory: ( - ) cough, ( - ) dyspnea, ( - ) wheezes Cardiovascular: ( - ) palpitation, ( - ) chest discomfort, ( - ) lower extremity swelling Gastrointestinal:  ( - ) nausea, ( - ) heartburn, ( - ) change in bowel habits Skin: ( - ) abnormal skin rashes Lymphatics: ( - ) new lymphadenopathy, ( - ) easy bruising Neurological: ( - ) numbness, ( - ) tingling, ( - ) new weaknesses Behavioral/Psych: ( - ) mood change, ( - ) new changes  All other systems were reviewed with the patient and are negative.  PHYSICAL EXAMINATION:  Vitals:   01/20/20 0959  BP: 127/86  Pulse: 66  Resp: 15  Temp: 97.9 F (36.6 C)  SpO2: 100%   Filed Weights   01/20/20 0959  Weight: 110 lb 12.8 oz (50.3 kg)    GENERAL: well appearing middle aged [place] American female in NAD  SKIN: skin color, texture, turgor are normal, no  rashes or significant lesions EYES: conjunctiva are pink and non-injected, sclera clear  LUNGS: clear to auscultation and percussion with normal breathing effort HEART: regular rate & rhythm and no murmurs and no lower extremity edema Musculoskeletal: no cyanosis of digits and no clubbing  PSYCH: alert & oriented x 3, fluent speech NEURO: no focal motor/sensory deficits  LABORATORY DATA:  I have reviewed the data as listed CBC Latest Ref Rng & Units 01/20/2020 06/17/2019  WBC 4.0 - 10.5 K/uL 3.5(L) 6.8  Hemoglobin 12.0 - 15.0 g/dL 7.6(L) 7.0(L)  Hematocrit 36.0 - 46.0 % 25.4(L) 24.9(L)  Platelets 150 - 400 K/uL 277 196    RADIOGRAPHIC STUDIES: No results found.  ASSESSMENT & PLAN Gina Copeland 44 y.o. female with medical history significant for a miscarriage in May 2021 who presents for evaluation of iron deficiency anemia.  After review the labs, the records, schedule the patient the findings are most consistent with iron deficiency anemia.  The etiology of this is not entirely clear but is most likely secondary to GYN bleeding, despite the fact the patient knows she does not have particularly heavy bleeding.  She also did have a miscarriage in May 2021 which may have also resulted in substantial blood loss.  At this time would recommend that she continue p.o. ferrous sulfate 325 mg daily as there is no indication for greater than daily dosing.  We will order repeat iron studies as well as CBC, CMP, reticulocyte panel.  The patient has been on p.o. iron since August and if she has not had an appreciable rise in her iron levels we will plan to administer IV iron instead.  The patient voiced understanding of this plan moving forward.  We will plan to see her back in 3 months time or sooner if IV iron is required  #Iron Deficiency Anemia 2/2 to GYN Bleeding --the patient does not have particularly heavy GYN bleeding, though this is the most likely cause of the patient's iron def  anemia --continue PO ferrous sulfate 325 mg daily. No indication for >once daily dosing. --will order repeat labs including iron panel/ferritin,CBC, CMP, and reticulocyte panel.  --if patient remains iron deficient with anemia will recommend IV feraheme 510 mg q 7days x 2 doses --RTC in 3 months or sooner if IV iron is required.   Orders Placed This Encounter  Procedures  . CBC with Differential (Cancer Center Only)    Standing Status:   Future    Number of Occurrences:   1    Standing Expiration Date:   01/19/2021  . Retic Panel    Standing Status:   Future    Number of Occurrences:   1    Standing Expiration Date:   01/19/2021  . CMP ([facility] only)    Standing Status:   Future    Number of Occurrences:   1    Standing Expiration Date:   01/19/2021  . Iron and TIBC    Standing Status:   Future    Number of Occurrences:   1    Standing Expiration Date:   01/19/2021  . Ferritin    Standing Status:   Future    Number of Occurrences:   1    Standing Expiration Date:   01/19/2021    All questions were answered. The patient knows to call the clinic with any problems, questions or concerns.  A total of more than 60 minutes were spent on this encounter and over half of that time was spent on counseling and coordination of care as outlined above.   Ledell Peoples, MD Department of Hematology/Oncology [place] [facility] Phone: [phone] Pager: [phone] Email: [email or web address]  01/20/2020 11:23 AM

## 2020-01-22 ENCOUNTER — Telehealth: Payer: Self-pay | Admitting: Hematology and Oncology

## 2020-01-22 NOTE — Telephone Encounter (Signed)
Scheduled per 12/20 los. Called pt and left a msg  °

## 2020-02-01 DIAGNOSIS — Z419 Encounter for procedure for purposes other than remedying health state, unspecified: Secondary | ICD-10-CM | POA: Diagnosis not present

## 2020-02-03 ENCOUNTER — Ambulatory Visit: Payer: Medicaid Other

## 2020-02-10 ENCOUNTER — Ambulatory Visit: Payer: Medicaid Other

## 2020-02-11 ENCOUNTER — Telehealth: Payer: Self-pay | Admitting: Hematology and Oncology

## 2020-02-11 NOTE — Telephone Encounter (Signed)
Called pt per 1/10 sch msg - left message for pt to call back to reschedule appt.   

## 2020-03-03 DIAGNOSIS — Z419 Encounter for procedure for purposes other than remedying health state, unspecified: Secondary | ICD-10-CM | POA: Diagnosis not present

## 2020-03-08 ENCOUNTER — Other Ambulatory Visit: Payer: Self-pay | Admitting: Hematology and Oncology

## 2020-03-08 DIAGNOSIS — D500 Iron deficiency anemia secondary to blood loss (chronic): Secondary | ICD-10-CM

## 2020-03-08 NOTE — Progress Notes (Signed)
No show

## 2020-03-09 ENCOUNTER — Inpatient Hospital Stay: Payer: Medicaid Other

## 2020-03-09 ENCOUNTER — Inpatient Hospital Stay: Payer: Medicaid Other | Attending: Hematology and Oncology | Admitting: Hematology and Oncology

## 2020-03-18 ENCOUNTER — Telehealth: Payer: Self-pay | Admitting: Hematology and Oncology

## 2020-03-18 NOTE — Telephone Encounter (Signed)
Scheduled appt per 2/15 sch msg -left message for pt with appt date and time.

## 2020-03-31 DIAGNOSIS — Z419 Encounter for procedure for purposes other than remedying health state, unspecified: Secondary | ICD-10-CM | POA: Diagnosis not present

## 2020-04-14 ENCOUNTER — Other Ambulatory Visit: Payer: Self-pay | Admitting: Hematology and Oncology

## 2020-04-14 DIAGNOSIS — D500 Iron deficiency anemia secondary to blood loss (chronic): Secondary | ICD-10-CM

## 2020-04-14 NOTE — Progress Notes (Signed)
No show

## 2020-04-15 ENCOUNTER — Encounter: Payer: Medicaid Other | Admitting: Hematology and Oncology

## 2020-04-15 ENCOUNTER — Other Ambulatory Visit: Payer: Medicaid Other

## 2020-04-15 DIAGNOSIS — D500 Iron deficiency anemia secondary to blood loss (chronic): Secondary | ICD-10-CM

## 2020-05-01 DIAGNOSIS — Z419 Encounter for procedure for purposes other than remedying health state, unspecified: Secondary | ICD-10-CM | POA: Diagnosis not present

## 2020-05-31 DIAGNOSIS — Z419 Encounter for procedure for purposes other than remedying health state, unspecified: Secondary | ICD-10-CM | POA: Diagnosis not present

## 2020-07-01 DIAGNOSIS — Z419 Encounter for procedure for purposes other than remedying health state, unspecified: Secondary | ICD-10-CM | POA: Diagnosis not present

## 2020-07-31 DIAGNOSIS — Z419 Encounter for procedure for purposes other than remedying health state, unspecified: Secondary | ICD-10-CM | POA: Diagnosis not present

## 2020-08-31 DIAGNOSIS — Z419 Encounter for procedure for purposes other than remedying health state, unspecified: Secondary | ICD-10-CM | POA: Diagnosis not present

## 2020-10-01 DIAGNOSIS — Z419 Encounter for procedure for purposes other than remedying health state, unspecified: Secondary | ICD-10-CM | POA: Diagnosis not present

## 2020-10-31 DIAGNOSIS — Z419 Encounter for procedure for purposes other than remedying health state, unspecified: Secondary | ICD-10-CM | POA: Diagnosis not present

## 2020-12-01 DIAGNOSIS — Z419 Encounter for procedure for purposes other than remedying health state, unspecified: Secondary | ICD-10-CM | POA: Diagnosis not present

## 2020-12-31 DIAGNOSIS — Z419 Encounter for procedure for purposes other than remedying health state, unspecified: Secondary | ICD-10-CM | POA: Diagnosis not present

## 2021-01-31 DIAGNOSIS — Z419 Encounter for procedure for purposes other than remedying health state, unspecified: Secondary | ICD-10-CM | POA: Diagnosis not present

## 2021-03-03 DIAGNOSIS — Z419 Encounter for procedure for purposes other than remedying health state, unspecified: Secondary | ICD-10-CM | POA: Diagnosis not present

## 2021-03-31 DIAGNOSIS — Z419 Encounter for procedure for purposes other than remedying health state, unspecified: Secondary | ICD-10-CM | POA: Diagnosis not present

## 2021-05-01 DIAGNOSIS — Z419 Encounter for procedure for purposes other than remedying health state, unspecified: Secondary | ICD-10-CM | POA: Diagnosis not present

## 2021-05-31 DIAGNOSIS — Z419 Encounter for procedure for purposes other than remedying health state, unspecified: Secondary | ICD-10-CM | POA: Diagnosis not present

## 2021-07-01 DIAGNOSIS — Z419 Encounter for procedure for purposes other than remedying health state, unspecified: Secondary | ICD-10-CM | POA: Diagnosis not present

## 2021-07-06 IMAGING — MG DIGITAL SCREENING BILAT W/ TOMO W/ CAD
8 series · 9 of 24 positions shown · non-contrast
Comparison: None.

CLINICAL DATA: Screening.

EXAM:
DIGITAL SCREENING BILATERAL MAMMOGRAM WITH TOMO AND CAD

[R CC synth-2D]
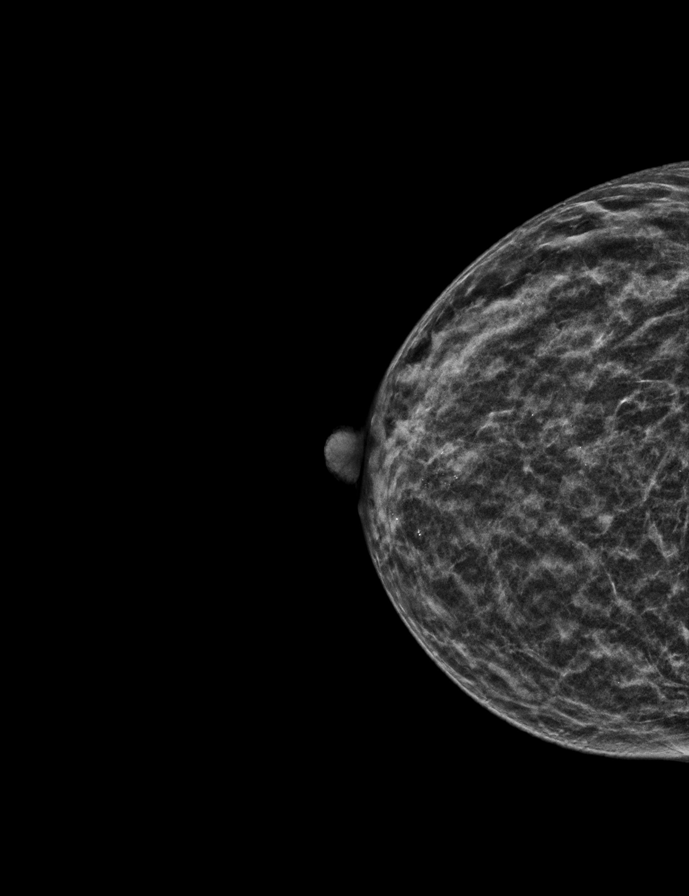

[R MLO synth-2D]
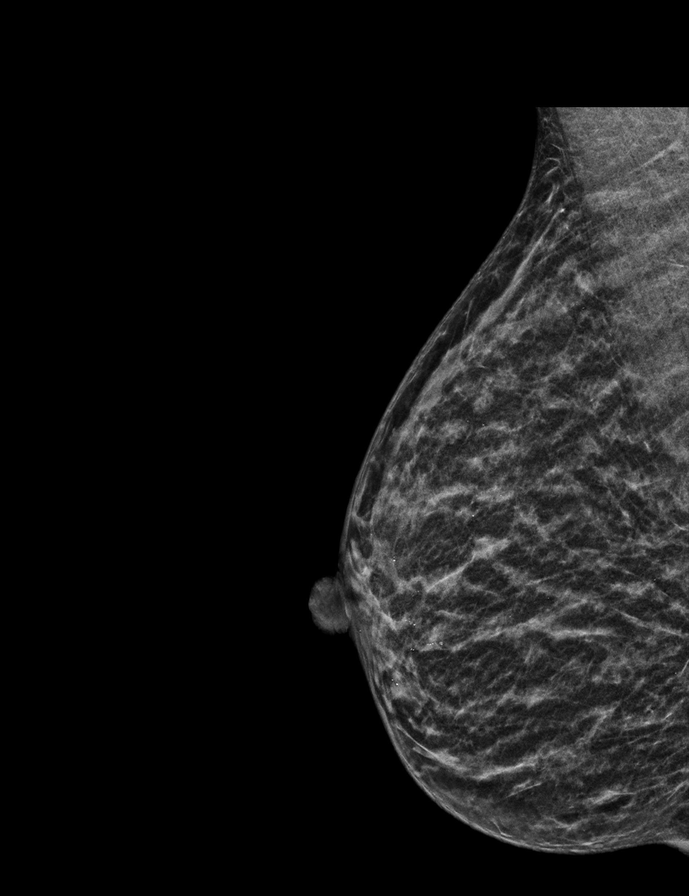

[L CC synth-2D]
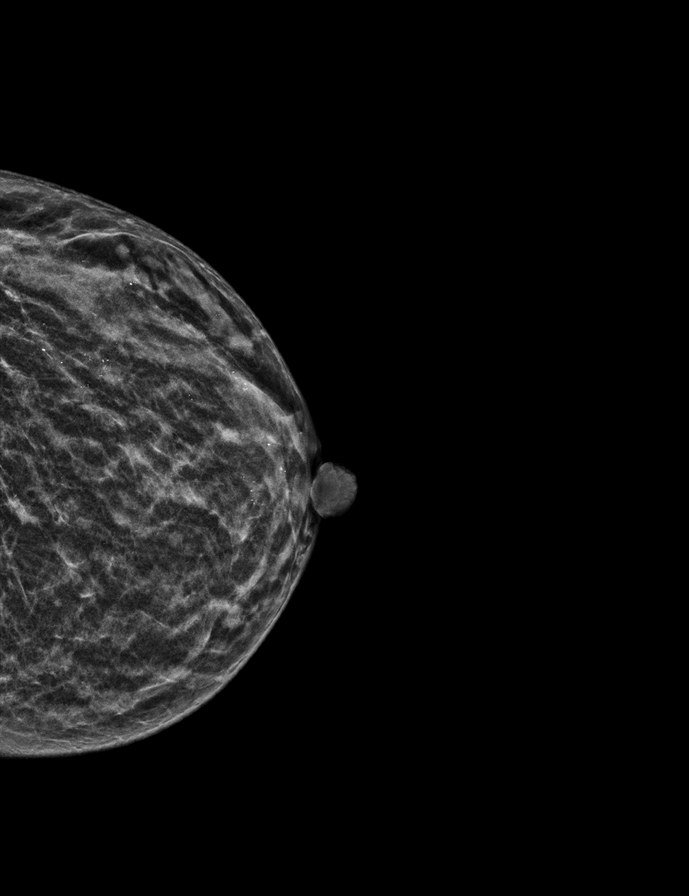

[L MLO synth-2D]
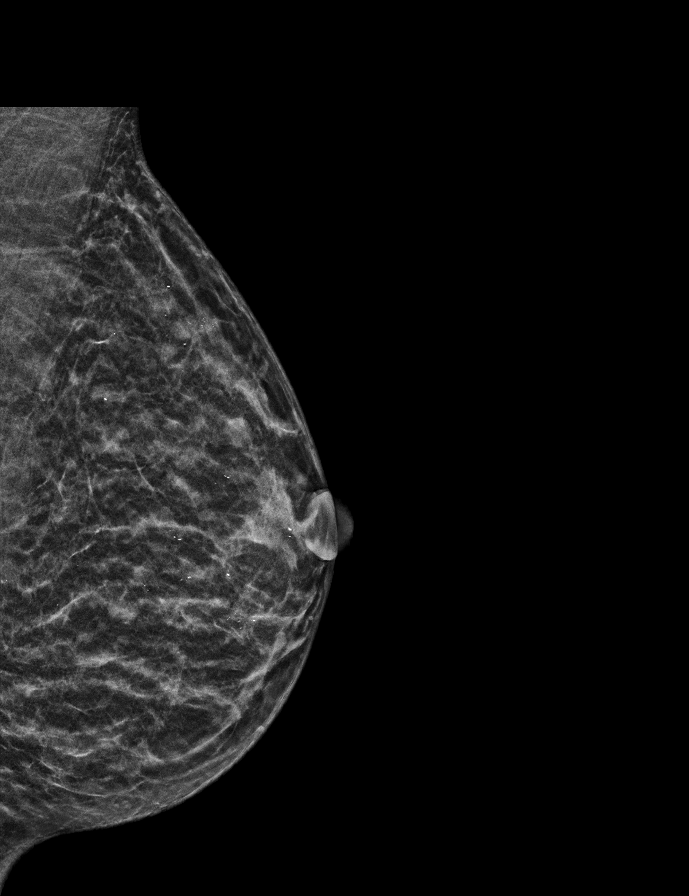

[L MLO tomo · 2 of 31 frames shown]
[frame 11/31]
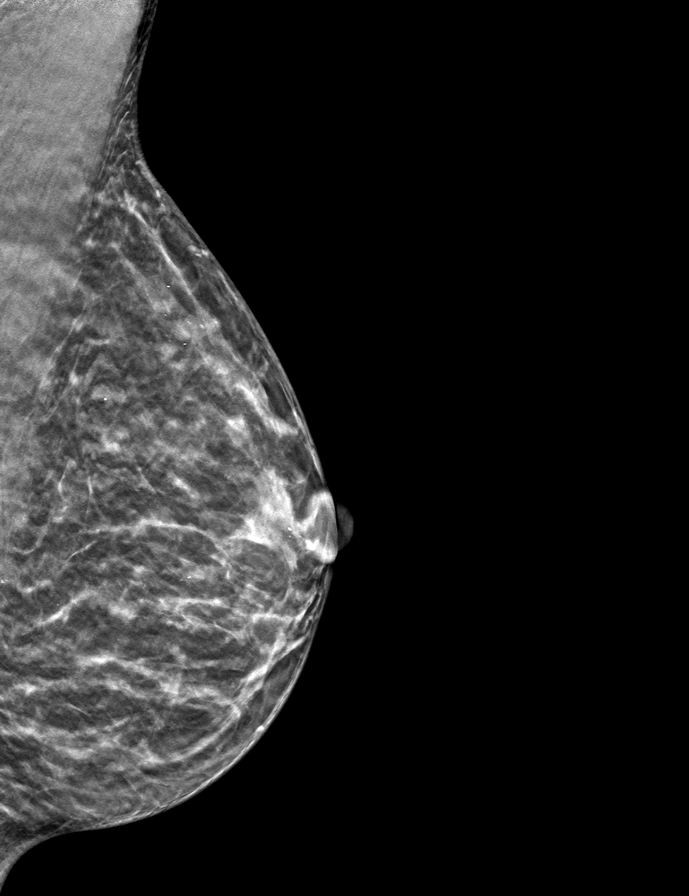
[frame 16/31]
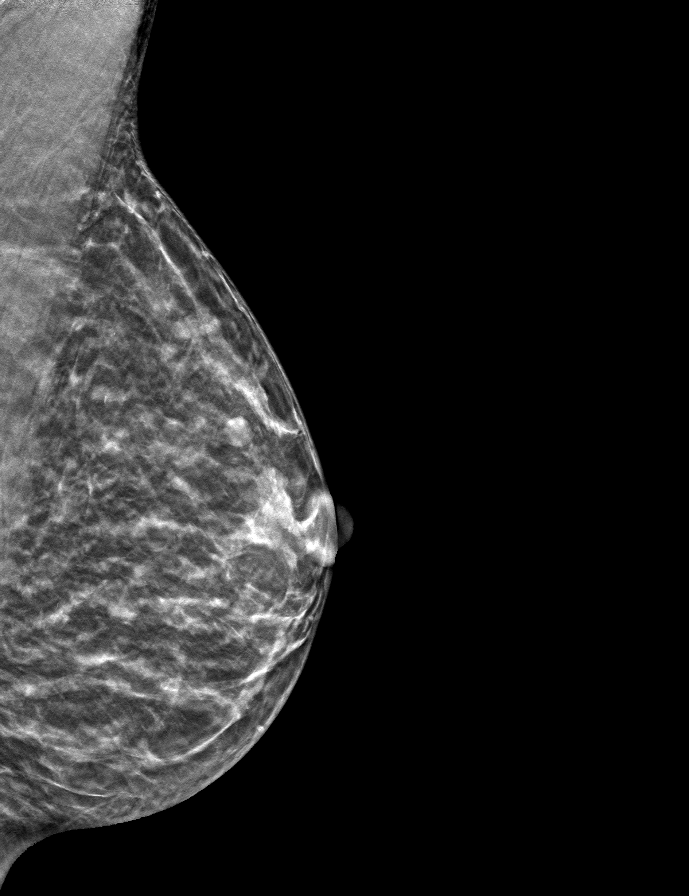

[R CC tomo · tomo slice 17/34.0]
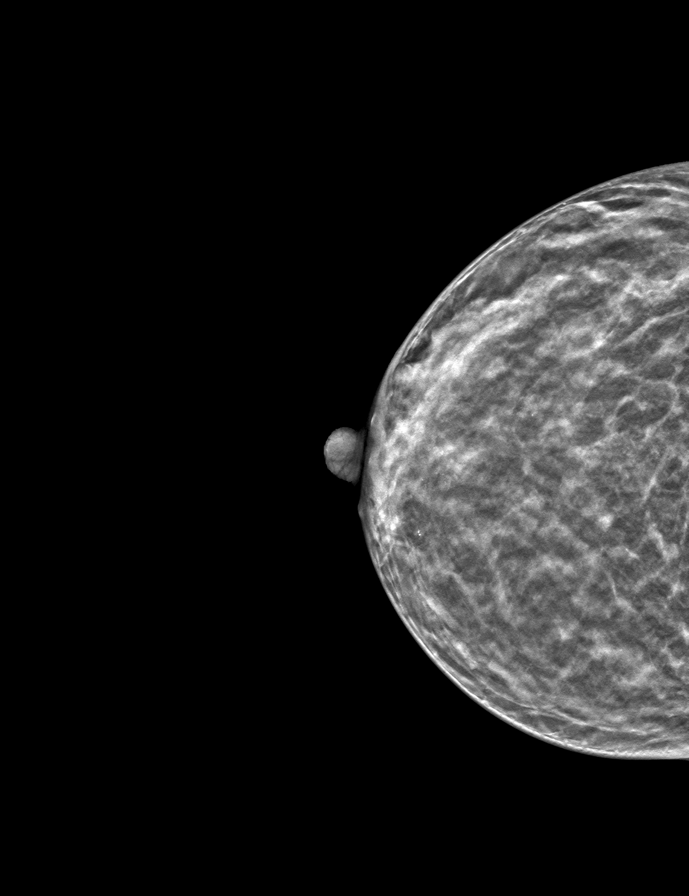

[L CC tomo · tomo slice 17/33.0]
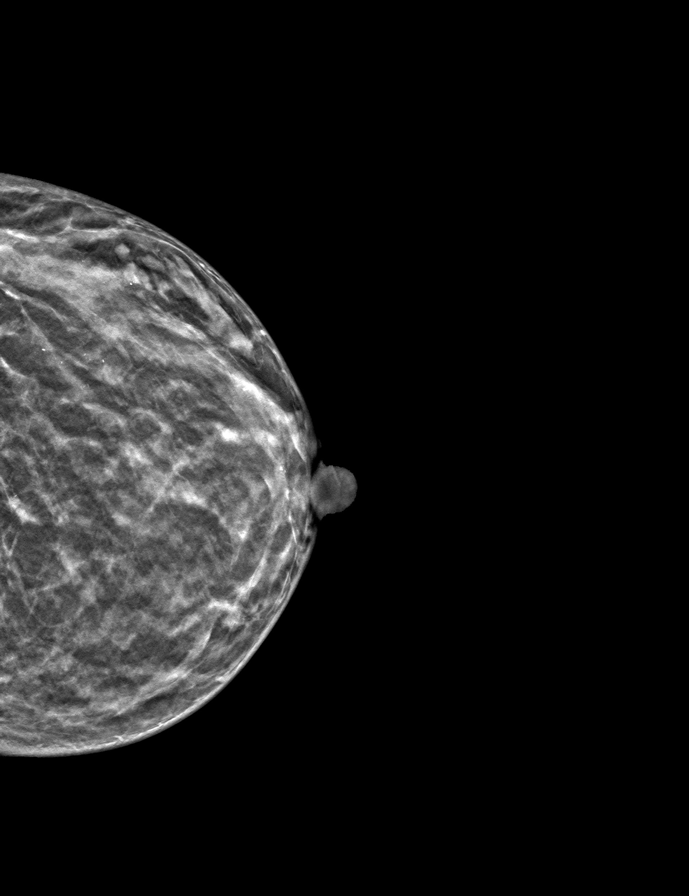

[R MLO tomo · tomo slice 16/31.0]
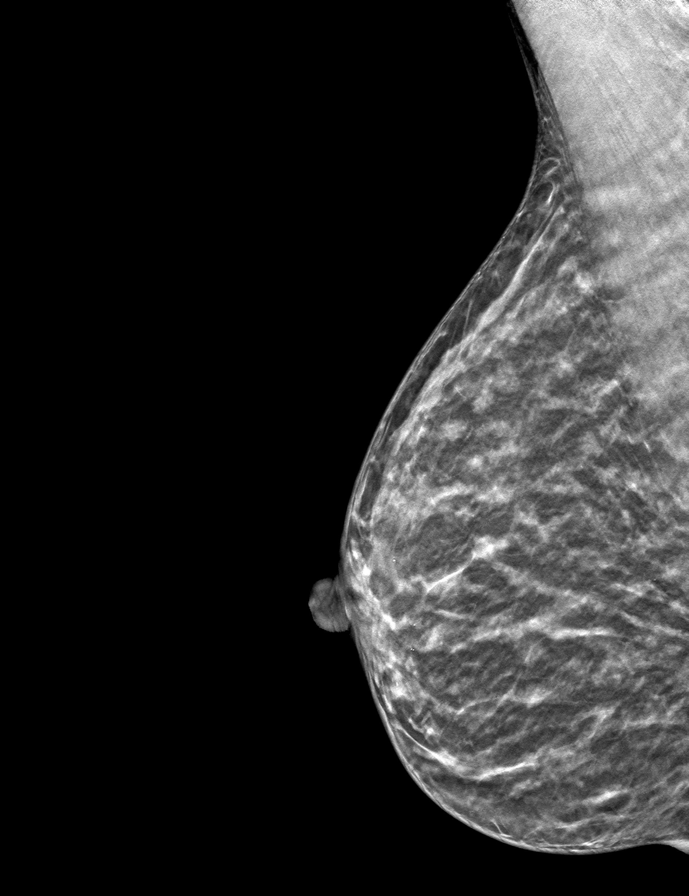

[9 of 24 positions shown; findings below may reference images not displayed]

ACR Breast Density Category c: The breast tissue is heterogeneously
dense, which may obscure small masses.
FINDINGS: In the right breast calcifications requires further evaluation.

In the left breast calcifications requires further evaluation.

Images were processed with CAD.
IMPRESSION: Further evaluation is suggested for possible calcifications in the
right breast.

Further evaluation is suggested for possible calcifications in the
left breast.

RECOMMENDATION:
Diagnostic mammogram and possibly ultrasound of both breasts.
(Code:[id number])

The patient will be contacted regarding the findings, and additional
imaging will be scheduled.

BI-RADS CATEGORY  0: Incomplete. Need additional imaging evaluation
and/or prior mammograms for comparison.

## 2021-07-31 DIAGNOSIS — Z419 Encounter for procedure for purposes other than remedying health state, unspecified: Secondary | ICD-10-CM | POA: Diagnosis not present

## 2021-08-31 DIAGNOSIS — Z419 Encounter for procedure for purposes other than remedying health state, unspecified: Secondary | ICD-10-CM | POA: Diagnosis not present

## 2021-10-01 DIAGNOSIS — Z419 Encounter for procedure for purposes other than remedying health state, unspecified: Secondary | ICD-10-CM | POA: Diagnosis not present

## 2021-10-31 DIAGNOSIS — Z419 Encounter for procedure for purposes other than remedying health state, unspecified: Secondary | ICD-10-CM | POA: Diagnosis not present

## 2021-12-01 DIAGNOSIS — Z419 Encounter for procedure for purposes other than remedying health state, unspecified: Secondary | ICD-10-CM | POA: Diagnosis not present

## 2021-12-22 ENCOUNTER — Other Ambulatory Visit: Payer: Self-pay | Admitting: Family

## 2021-12-22 DIAGNOSIS — Z13220 Encounter for screening for lipoid disorders: Secondary | ICD-10-CM | POA: Diagnosis not present

## 2021-12-22 DIAGNOSIS — Z130 Encounter for screening for diseases of the blood and blood-forming organs and certain disorders involving the immune mechanism: Secondary | ICD-10-CM | POA: Diagnosis not present

## 2021-12-22 DIAGNOSIS — E559 Vitamin D deficiency, unspecified: Secondary | ICD-10-CM | POA: Diagnosis not present

## 2021-12-22 DIAGNOSIS — Z0000 Encounter for general adult medical examination without abnormal findings: Secondary | ICD-10-CM | POA: Diagnosis not present

## 2021-12-22 DIAGNOSIS — Z114 Encounter for screening for human immunodeficiency virus [HIV]: Secondary | ICD-10-CM | POA: Diagnosis not present

## 2021-12-22 DIAGNOSIS — Z1231 Encounter for screening mammogram for malignant neoplasm of breast: Secondary | ICD-10-CM

## 2021-12-22 DIAGNOSIS — Z1329 Encounter for screening for other suspected endocrine disorder: Secondary | ICD-10-CM | POA: Diagnosis not present

## 2021-12-22 DIAGNOSIS — Z131 Encounter for screening for diabetes mellitus: Secondary | ICD-10-CM | POA: Diagnosis not present

## 2021-12-22 DIAGNOSIS — Z1159 Encounter for screening for other viral diseases: Secondary | ICD-10-CM | POA: Diagnosis not present

## 2021-12-22 DIAGNOSIS — D509 Iron deficiency anemia, unspecified: Secondary | ICD-10-CM | POA: Diagnosis not present

## 2021-12-22 DIAGNOSIS — Z113 Encounter for screening for infections with a predominantly sexual mode of transmission: Secondary | ICD-10-CM | POA: Diagnosis not present

## 2021-12-31 DIAGNOSIS — Z419 Encounter for procedure for purposes other than remedying health state, unspecified: Secondary | ICD-10-CM | POA: Diagnosis not present

## 2022-01-10 NOTE — Result Encounter Note (Signed)
 Your A1C was 6.0%; indicating that you are prediabetic. Please limit your intake of carbs (pasta, rice, potatoes, bread), sugars (cakes, pieces, candy, cookies) and fruity/sweet drinks (tea, juice). It is encouraged that you increase your daily physical activity to 30 mins/day at least 3-4 times/week and drink 4-5 bottles of water daily.  We will recheck these levels again in 3-6 months.

## 2022-01-31 DIAGNOSIS — Z419 Encounter for procedure for purposes other than remedying health state, unspecified: Secondary | ICD-10-CM | POA: Diagnosis not present

## 2022-02-17 ENCOUNTER — Ambulatory Visit
Admission: RE | Admit: 2022-02-17 | Discharge: 2022-02-17 | Disposition: A | Discharge status: Home / Self Care | Payer: Medicaid Other | Source: Ambulatory Visit | Attending: Family | Admitting: Family

## 2022-02-17 DIAGNOSIS — Z1231 Encounter for screening mammogram for malignant neoplasm of breast: Secondary | ICD-10-CM

## 2022-02-18 ENCOUNTER — Other Ambulatory Visit: Payer: Self-pay | Admitting: Hematology and Oncology

## 2022-03-03 DIAGNOSIS — Z419 Encounter for procedure for purposes other than remedying health state, unspecified: Secondary | ICD-10-CM | POA: Diagnosis not present

## 2022-04-01 DIAGNOSIS — Z419 Encounter for procedure for purposes other than remedying health state, unspecified: Secondary | ICD-10-CM | POA: Diagnosis not present

## 2022-05-02 DIAGNOSIS — Z419 Encounter for procedure for purposes other than remedying health state, unspecified: Secondary | ICD-10-CM | POA: Diagnosis not present

## 2022-06-01 DIAGNOSIS — Z419 Encounter for procedure for purposes other than remedying health state, unspecified: Secondary | ICD-10-CM | POA: Diagnosis not present

## 2022-07-02 DIAGNOSIS — Z419 Encounter for procedure for purposes other than remedying health state, unspecified: Secondary | ICD-10-CM | POA: Diagnosis not present

## 2022-08-01 DIAGNOSIS — Z419 Encounter for procedure for purposes other than remedying health state, unspecified: Secondary | ICD-10-CM | POA: Diagnosis not present

## 2022-09-01 DIAGNOSIS — Z419 Encounter for procedure for purposes other than remedying health state, unspecified: Secondary | ICD-10-CM | POA: Diagnosis not present

## 2022-10-02 DIAGNOSIS — Z419 Encounter for procedure for purposes other than remedying health state, unspecified: Secondary | ICD-10-CM | POA: Diagnosis not present

## 2022-11-01 DIAGNOSIS — Z419 Encounter for procedure for purposes other than remedying health state, unspecified: Secondary | ICD-10-CM | POA: Diagnosis not present

## 2022-12-02 DIAGNOSIS — Z419 Encounter for procedure for purposes other than remedying health state, unspecified: Secondary | ICD-10-CM | POA: Diagnosis not present

## 2023-01-01 DIAGNOSIS — Z419 Encounter for procedure for purposes other than remedying health state, unspecified: Secondary | ICD-10-CM | POA: Diagnosis not present

## 2023-01-13 ENCOUNTER — Other Ambulatory Visit: Payer: Self-pay | Admitting: Family

## 2023-01-13 DIAGNOSIS — Z1231 Encounter for screening mammogram for malignant neoplasm of breast: Secondary | ICD-10-CM

## 2023-02-01 DIAGNOSIS — Z419 Encounter for procedure for purposes other than remedying health state, unspecified: Secondary | ICD-10-CM | POA: Diagnosis not present

## 2023-02-21 ENCOUNTER — Ambulatory Visit
Admission: RE | Admit: 2023-02-21 | Discharge: 2023-02-21 | Disposition: A | Discharge status: Home / Self Care | Payer: 59 | Source: Ambulatory Visit | Attending: Family | Admitting: Family

## 2023-02-21 DIAGNOSIS — Z1231 Encounter for screening mammogram for malignant neoplasm of breast: Secondary | ICD-10-CM | POA: Diagnosis not present

## 2023-03-04 DIAGNOSIS — Z419 Encounter for procedure for purposes other than remedying health state, unspecified: Secondary | ICD-10-CM | POA: Diagnosis not present

## 2023-04-01 DIAGNOSIS — Z419 Encounter for procedure for purposes other than remedying health state, unspecified: Secondary | ICD-10-CM | POA: Diagnosis not present

## 2023-04-03 DIAGNOSIS — H5213 Myopia, bilateral: Secondary | ICD-10-CM | POA: Diagnosis not present

## 2023-05-13 DIAGNOSIS — Z419 Encounter for procedure for purposes other than remedying health state, unspecified: Secondary | ICD-10-CM | POA: Diagnosis not present

## 2023-06-12 DIAGNOSIS — Z419 Encounter for procedure for purposes other than remedying health state, unspecified: Secondary | ICD-10-CM | POA: Diagnosis not present

## 2023-07-13 DIAGNOSIS — Z419 Encounter for procedure for purposes other than remedying health state, unspecified: Secondary | ICD-10-CM | POA: Diagnosis not present

## 2023-09-01 ENCOUNTER — Encounter (HOSPITAL_COMMUNITY): Payer: Self-pay | Admitting: Emergency Medicine

## 2023-09-01 ENCOUNTER — Other Ambulatory Visit: Payer: Self-pay

## 2023-09-01 ENCOUNTER — Emergency Department (HOSPITAL_COMMUNITY): Admission: EM | Admit: 2023-09-01 | Discharge: 2023-09-01 | Disposition: A | Discharge status: Home / Self Care

## 2023-09-01 DIAGNOSIS — D509 Iron deficiency anemia, unspecified: Secondary | ICD-10-CM | POA: Diagnosis not present

## 2023-09-01 DIAGNOSIS — D72810 Lymphocytopenia: Secondary | ICD-10-CM | POA: Diagnosis not present

## 2023-09-01 DIAGNOSIS — M25511 Pain in right shoulder: Secondary | ICD-10-CM | POA: Diagnosis not present

## 2023-09-01 DIAGNOSIS — I889 Nonspecific lymphadenitis, unspecified: Secondary | ICD-10-CM | POA: Diagnosis not present

## 2023-09-01 DIAGNOSIS — M542 Cervicalgia: Secondary | ICD-10-CM | POA: Diagnosis present

## 2023-09-01 LAB — BASIC METABOLIC PANEL WITH GFR
Anion gap: 6 (ref 5–15)
BUN: 6 mg/dL (ref 6–20)
CO2: 24 mmol/L (ref 22–32)
Calcium: 8.7 mg/dL — ABNORMAL LOW (ref 8.9–10.3)
Chloride: 108 mmol/L (ref 98–111)
Creatinine, Ser: 0.59 mg/dL (ref 0.44–1.00)
GFR, Estimated: >60 mL/min (ref >60)
Glucose, Bld: 91 mg/dL (ref 70–99)
Potassium: 3.6 mmol/L (ref 3.5–5.1)
Sodium: 138 mmol/L (ref 135–145)

## 2023-09-01 LAB — CBC
HCT: 24.6 % — ABNORMAL LOW (ref 36.0–46.0)
Hemoglobin: 6.6 g/dL — CL (ref 12.0–15.0)
MCH: 18.4 pg — ABNORMAL LOW (ref 26.0–34.0)
MCHC: 26.8 g/dL — ABNORMAL LOW (ref 30.0–36.0)
MCV: 68.7 fL — ABNORMAL LOW (ref 80.0–100.0)
Platelets: 183 K/uL (ref 150–400)
RBC: 3.58 MIL/uL — ABNORMAL LOW (ref 3.87–5.11)
RDW: 20 % — ABNORMAL HIGH (ref 11.5–15.5)
WBC: 3.6 K/uL — ABNORMAL LOW (ref 4.0–10.5)
nRBC: 0 % (ref 0.0–0.2)

## 2023-09-01 MED ORDER — CEPHALEXIN 500 MG PO CAPS: 500.0000 mg | ORAL_CAPSULE | Freq: Four times a day (QID) | ORAL | 0 refills | Status: AC

## 2023-09-01 MED ORDER — ACETAMINOPHEN 500 MG PO TABS
1000.0000 mg | ORAL_TABLET | Freq: Once | ORAL | Status: AC
  Administered 2023-09-01: 1000 mg via ORAL
  Filled 2023-09-01: qty 2

## 2023-09-01 NOTE — ED Triage Notes (Signed)
 Pt c/o pain to right collarbone and right side of neck with localized swelling noted to clavicle. No recent infection and no ear or throat pain per pt. Full ROM, no difficulty breathing/swallowing/talking. Pain rated 6/10, worse with movement.

## 2023-09-01 NOTE — ED Notes (Signed)
 Patient transported to CT

## 2023-09-01 NOTE — Discharge Instructions (Addendum)
 You were seen today for neck pain and skin nodule. While you were here we monitored your vitals, preformed a physical exam, and labs. These were all reassuring and there is no indication for any further testing or intervention in the emergency department at this time.  You do have some chronic anemia and you should begin taking your iron supplementation immediately.  The nodule on your clavicle is likely an inflamed lymph node which can be caused by infection  Things to do:  - Follow up with your primary care provider within the next 1-2 weeks - Please follow-up with hematology, referral has been placed -Please begin retaking your iron supplements - Please take the antibiotics as prescribed 4 times daily for 10 days  Return to the emergency department if you have any new or worsening symptoms including fatigue, difficulty walking, shortness of breath, chest pain, changes in vision or hearing, or if you have any other concerns.

## 2023-09-01 NOTE — ED Triage Notes (Signed)
 Pt to ED c/o right sided neck pain x 2 days, reports 2 days ago seeing a knot on the right side of neck. No known injury. NAD

## 2023-09-01 NOTE — ED Provider Notes (Signed)
 [place] [facility] Provider Note  MDM   HPI/ROS:  Gina Copeland is a 48 y.o. female with a medical history as below who presents with a nodule on her right clavicle and right sided posterior neck pain this been going on for 2 days.  She states she woke up with this pain and noticed this new bump.  This is what prompted her to come to the emergency department.  She denies any fevers, chills, nausea or vomiting.  Also denies any headaches, changes in vision, chest pain or shortness of breath.  She states the pain in her neck is exacerbated by palpation and movement.  She also states the nodule on her clavicle is painful to touch  Physical exam is notable for: - Tender nodule within the skin sitting atop the right clavicle.  It is mobile and nonerythematous with no palpable fluctuance.  Tenderness to palpation across the right trapezius extending into the right paracervical muscles.  Pain with ROM of the neck however motion remains intact  On my initial evaluation, patient is:  -Vital signs stable. Patient afebrile, hemodynamically stable, and non-toxic appearing. -Additional history obtained from chart review  This patient's current presentation, including their history and physical exam, is most consistent with lymphadenitis and musculoskeletal pain. Differentials include adenitis, dissection, MSK, meningitis, DVT or arterial thrombus, infection, malignancy.    On my exam patient is hemodynamically stable.  Her neck pain does seem to be musculoskeletal in origin given her pain with motion and palpation across muscular regions.  She is neurologically intact and I have low concern for critical spinal canal stenosis.  The nodule sits on top of her right clavicle and is tender to palpation.  When moving the skin the nodule does move within the skin lowering my concern concern for lymphoma and however she is relatively tender in this area.  She is lymphopenic which is  consistent with labs 3 years ago.  She did have HIV testing done which was negative.  She is also anemic to 6.6 it appears her baseline is approximately 7.  Did consider blood transfusion and discussed with the patient however she would prefer to defer this if possible.  She is not too far off her baseline and we did discuss risk and benefits of this however she still prefers to defer transfusion at this time.  She does states she has not been taking her iron supplementation and I educated her on the importance of this and instructed her to restart this medication as soon as possible.  Given her lymphopenia as well will refer her to hematology.  She is overall asymptomatic from her anemia at this time.  Given the location of this nodule and her hemoglobin relatively near baseline without neurologic symptoms my concern for dissection is low enough that I would defer CT scan at this time.  She was given Tylenol  for her pain.  She has no infectious signs or symptoms that would indicate greater systemic infection or sepsis or meningitis.  The location of the nodule is less consistent with a DVT or arterial thrombus given its its the top of the clavicle.  She does have some other lymphadenopathy spreading up her cervical chain.  Given this I think appropriate to treat with Keflex.  Discussed this with the patient who states understanding is in agreement.  She was discharged in stable conditions with instruction to follow-up with her PCP, begin her antibiotics and restart her iron supplementation.  Referral for  hematology was placed.  Discharged in stable condition.  Interpretations, interventions, and the patient's course of care are documented below.    Clinical Course as of 09/01/23 2002  Fri Sep 01, 2023  2001 Hemoglobin(!!): 6.6 Microcytic anemia likely iron deficiency.  Relatively near baseline and asymptomatic [RC]  2001 WBC(!): 3.6 Lymphopenia consistent with prior labs.  Negative HIV screen 2 years ago  [RC]  2002 BMP without gross electrolyte abnormalities, renal function appropriate [RC]    Clinical Course User Index [RC] Sharyne Darina RAMAN, MD      Disposition:  I discussed the plan for discharge with the patient and/or their surrogate at bedside prior to discharge and they were in agreement with the plan and verbalized understanding of the return precautions provided. All questions answered to the best of my ability. Ultimately, the patient was discharged in stable condition with stable vital signs. I am reassured that they are capable of close follow up and good social support at home.   Clinical Impression:  1. Iron deficiency anemia, unspecified iron deficiency anemia type   2. Lymphadenitis   3. Lymphopenia     Rx / DC Orders ED Discharge Orders          Ordered    cephALEXin (KEFLEX) 500 MG capsule  4 times daily        09/01/23 1947    Ambulatory referral to Hematology / Oncology        09/01/23 1951            The plan for this patient was discussed with Dr. Ula, who voiced agreement and who oversaw evaluation and treatment of this patient.   Clinical Complexity A medically appropriate history, review of systems, and physical exam was performed.  My independent interpretations of EKG, labs, and radiology are documented in the ED course above.   If decision rules were used in this patient's evaluation, they are listed below.   Click here for ABCD2, HEART and other calculatorsREFRESH Note before signing   Patient's presentation is most consistent with acute presentation with potential threat to life or bodily function.  Medical Decision Making Amount and/or Complexity of Data Reviewed Labs: ordered.  Risk OTC drugs. Prescription drug management.    HPI/ROS      See MDM section for pertinent HPI and ROS. A complete ROS was performed with pertinent positives/negatives noted above.   Past Medical History:  Diagnosis Date   Heart murmur     Past  Surgical History:  Procedure Laterality Date   NO PAST SURGERIES        Physical Exam   Vitals:   09/01/23 1845 09/01/23 1900 09/01/23 1915 09/01/23 1945  BP: 139/83 (!) 141/90 131/82 124/86  Pulse:      Resp:      Temp:      SpO2:      Weight:      Height:        Physical Exam Vitals and nursing note reviewed.  Constitutional:      General: She is not in acute distress.    Appearance: She is well-developed.  HENT:     Head: Normocephalic and atraumatic.  Eyes:     Conjunctiva/sclera: Conjunctivae normal.  Neck:     Comments: Tender nodule within the skin sitting atop the right clavicle.  It is mobile and nonerythematous with no palpable fluctuance.  Cervical chain adenopathy.  tenderness to palpation across the right trapezius extending into the right paracervical muscles.  Pain with  ROM of the neck however motion remains intact  Cardiovascular:     Rate and Rhythm: Normal rate and regular rhythm.     Heart sounds: No murmur heard. Pulmonary:     Effort: Pulmonary effort is normal. No respiratory distress.     Breath sounds: Normal breath sounds.  Abdominal:     Palpations: Abdomen is soft.     Tenderness: There is no abdominal tenderness.  Musculoskeletal:        General: No swelling.     Cervical back: Neck supple.  Skin:    General: Skin is warm and dry.     Capillary Refill: Capillary refill takes less than 2 seconds.  Neurological:     Mental Status: She is alert.  Psychiatric:        Mood and Affect: Mood normal.      Procedures   If procedures were preformed on this patient, they are listed below:  Procedures   @BBSIG @   Please note that this documentation was produced with the assistance of voice-to-text technology and may contain errors.    Sharyne Darina RAMAN, MD 09/01/23 VITO    Ula Prentice SAUNDERS, MD 09/01/23 [phone]

## 2024-01-12 DIAGNOSIS — R8290 Unspecified abnormal findings in urine: Secondary | ICD-10-CM | POA: Diagnosis not present

## 2024-01-12 DIAGNOSIS — Z13228 Encounter for screening for other metabolic disorders: Secondary | ICD-10-CM | POA: Diagnosis not present

## 2024-01-12 DIAGNOSIS — E559 Vitamin D deficiency, unspecified: Secondary | ICD-10-CM | POA: Diagnosis not present

## 2024-01-12 DIAGNOSIS — Z113 Encounter for screening for infections with a predominantly sexual mode of transmission: Secondary | ICD-10-CM | POA: Diagnosis not present

## 2024-01-12 DIAGNOSIS — R7303 Prediabetes: Secondary | ICD-10-CM | POA: Diagnosis not present

## 2024-01-12 DIAGNOSIS — D509 Iron deficiency anemia, unspecified: Secondary | ICD-10-CM | POA: Diagnosis not present

## 2024-01-12 DIAGNOSIS — Z7689 Persons encountering health services in other specified circumstances: Secondary | ICD-10-CM | POA: Diagnosis not present

## 2024-02-09 ENCOUNTER — Other Ambulatory Visit: Payer: Self-pay | Admitting: Family Medicine

## 2024-02-09 DIAGNOSIS — Z1231 Encounter for screening mammogram for malignant neoplasm of breast: Secondary | ICD-10-CM
